# Patient Record
Sex: Male | Born: 1995 | Race: Black or African American | Hispanic: No | Marital: Single | Smoking: Never smoker
Health system: Southern US, Community
[De-identification: ages and names within clinical notes are randomized; demographics above are authoritative.]

## PROBLEM LIST (undated history)

## (undated) HISTORY — PX: HERNIA REPAIR: SHX51

---

## 2017-12-08 ENCOUNTER — Other Ambulatory Visit: Payer: Self-pay

## 2017-12-08 ENCOUNTER — Emergency Department
Admission: EM | Admit: 2017-12-08 | Discharge: 2017-12-08 | Disposition: A | Discharge status: Home / Self Care | Payer: Medicaid Other | Attending: Emergency Medicine | Admitting: Emergency Medicine

## 2017-12-08 DIAGNOSIS — S81811A Laceration without foreign body, right lower leg, initial encounter: Secondary | ICD-10-CM | POA: Insufficient documentation

## 2017-12-08 DIAGNOSIS — Y92310 Basketball court as the place of occurrence of the external cause: Secondary | ICD-10-CM | POA: Insufficient documentation

## 2017-12-08 DIAGNOSIS — W269XXA Contact with unspecified sharp object(s), initial encounter: Secondary | ICD-10-CM | POA: Diagnosis not present

## 2017-12-08 DIAGNOSIS — Y9367 Activity, basketball: Secondary | ICD-10-CM | POA: Diagnosis not present

## 2017-12-08 DIAGNOSIS — Y999 Unspecified external cause status: Secondary | ICD-10-CM | POA: Insufficient documentation

## 2017-12-08 MED ORDER — LIDOCAINE-EPINEPHRINE (PF) 1 %-1:200000 IJ SOLN
INTRAMUSCULAR | Status: AC
  Administered 2017-12-08: 30 mL
  Filled 2017-12-08: qty 30

## 2017-12-08 MED ORDER — TETANUS-DIPHTH-ACELL PERTUSSIS 5-2.5-18.5 LF-MCG/0.5 IM SUSP
INTRAMUSCULAR | Status: AC
  Administered 2017-12-08: 0.5 mL via INTRAMUSCULAR
  Filled 2017-12-08: qty 0.5

## 2017-12-08 MED ORDER — CEPHALEXIN 500 MG PO CAPS
500.0000 mg | ORAL_CAPSULE | Freq: Once | ORAL | Status: AC
  Administered 2017-12-08: 500 mg via ORAL

## 2017-12-08 MED ORDER — CEPHALEXIN 500 MG PO CAPS
ORAL_CAPSULE | ORAL | Status: AC
  Filled 2017-12-08: qty 1

## 2017-12-08 MED ORDER — CEPHALEXIN 500 MG PO CAPS: 500.0000 mg | ORAL_CAPSULE | Freq: Three times a day (TID) | ORAL | 0 refills | Status: AC

## 2017-12-08 NOTE — ED Provider Notes (Signed)
[facility] Emergency Department Provider Note  ____________________________________________   I have reviewed the triage vital signs and the nursing notes. Where available I have reviewed prior notes and, if possible and indicated, outside hospital notes.    HISTORY  Chief Complaint Laceration    HPI Adam Guerra is a 22 y.o. male basketball and cut his right lower leg on a bolt that was holding up the back stop.  Patient did not suffer any other injuries.  Bleeding controlled.  Anti-septic was applied by local observers immediately thereafter.  Tetanus not known to be up-to-date.    History reviewed. No pertinent past medical history.  There are no active problems to display for this patient.   Past Surgical History:  Procedure Laterality Date  . HERNIA REPAIR      Prior to Admission medications   Not on File    Allergies Patient has no known allergies.  No family history on file.  Social History Social History   Tobacco Use  . Smoking status: Never Smoker  . Smokeless tobacco: Never Used  Substance Use Topics  . Alcohol use: No    Frequency: Never  . Drug use: Yes    Types: Marijuana    Review of Systems    ____________________________________________   PHYSICAL EXAM:  VITAL SIGNS: ED Triage Vitals  Enc Vitals Group     BP 12/08/17 2134 137/87     Pulse Rate 12/08/17 2134 61     Resp 12/08/17 2134 16     Temp 12/08/17 2134 98.2 F (36.8 C)     Temp Source 12/08/17 2134 Oral     SpO2 12/08/17 2134 100 %     Weight 12/08/17 2131 135 lb (61.2 kg)     Height 12/08/17 2131 5\' 10"  (1.778 m)     Head Circumference --      Peak Flow --      Pain Score 12/08/17 2131 8     Pain Loc --      Pain Edu? --      Excl. in [place]? --     Constitutional: Alert and oriented. Well appearing and in no acute distress. Head: Atraumatic  Musculoskeletal: No lower extremity aside from the area of injury, no upper extremity  tenderness. No joint effusions, no DVT signs strong distal pulses no edema Neurologic:  Normal speech and language. No gross focal neurologic deficits are appreciated.  Skin:  Skin is warm, dry and intact.  There is an abrasion/laceration which is deep, towards the muscle 2.7 cm no foreign body noted. Psychiatric: Mood and affect are normal. Speech and behavior are normal.  ____________________________________________   LABS (all labs ordered are listed, but only abnormal results are displayed)  Labs Reviewed - No data to display  Pertinent labs  results that were available during my care of the patient were reviewed by me and considered in my medical decision making (see chart for details). ____________________________________________  EKG  I personally interpreted any EKGs ordered by me or triage  ____________________________________________  RADIOLOGY  Pertinent labs & imaging results that were available during my care of the patient were reviewed by me and considered in my medical decision making (see chart for details). If possible, patient and/or family made aware of any abnormal findings.  No results found. ____________________________________________    PROCEDURES  Procedure(s) performed: LACERATION REPAIR Performed by: Jeanmarie Plant Authorized by: Jeanmarie Plant Consent: Verbal consent obtained. Risks and benefits: risks, benefits and alternatives  were discussed Consent given by: patient Patient identity confirmed: provided demographic data Prepped and Draped in normal sterile fashion Wound explored  Laceration Location: Right pretibial region  Laceration Length: 2.7 cm cm  No Foreign Bodies seen or palpated extensive exploration showed no foreign body  Anesthesia: local infiltration  Local anesthetic: lidocaine 2% % with epinephrine  Anesthetic total: 5 ml  Irrigation method: syringe Amount of cleaning: standard  Skin closure: 3-0 nylon  Number  of sutures: 5  Technique: Interrupted  Patient tolerance: Patient tolerated the procedure well with no immediate complications.   Procedures  Critical Care performed: None  ____________________________________________   INITIAL IMPRESSION / ASSESSMENT AND PLAN / ED COURSE  Pertinent labs & imaging results that were available during my care of the patient were reviewed by me and considered in my medical decision making (see chart for details).  with a wound to his right lower extremity, it was incurred immediately prior to arrival.  Copious irrigation under pressure, chlorhexidine, tetanus shot, irrigation with normal saline after chlorhexidine, Keflex, sterile dressing, return precautions and follow-up.  No other injury noted, compartments soft.    ____________________________________________   FINAL CLINICAL IMPRESSION(S) / ED DIAGNOSES  Final diagnoses:  None      This chart was dictated using voice recognition software.  Despite best efforts to proofread,  errors can occur which can change meaning.      Jeanmarie PlantMcShane, Dresean Beckel A, MD 12/08/17 [id number]

## 2017-12-08 NOTE — Discharge Instructions (Signed)
Keep the area clean and dry, use a barrier petroleum-based product such as Vaseline or bacitracin, avoid strenuous exercise until the wound heals, have the suture removed in 7-10 days, return for any signs of infection including fever, redness, redness around the area, streaks from the area increased pain or pus.

## 2017-12-08 NOTE — ED Triage Notes (Signed)
Pt states he was playing basketball and lacerated his right lower leg on a rusty bolt

## 2019-09-24 ENCOUNTER — Encounter: Payer: Self-pay | Admitting: Family Medicine

## 2019-09-24 ENCOUNTER — Other Ambulatory Visit: Payer: Self-pay

## 2019-09-24 ENCOUNTER — Ambulatory Visit: Payer: Self-pay | Admitting: Family Medicine

## 2019-09-24 DIAGNOSIS — Z113 Encounter for screening for infections with a predominantly sexual mode of transmission: Secondary | ICD-10-CM

## 2019-09-24 DIAGNOSIS — Z202 Contact with and (suspected) exposure to infections with a predominantly sexual mode of transmission: Secondary | ICD-10-CM

## 2019-09-24 MED ORDER — AZITHROMYCIN 500 MG PO TABS
1000.0000 mg | ORAL_TABLET | Freq: Once | ORAL | Status: AC
  Administered 2019-09-24: 11:00:00 1000 mg via ORAL

## 2019-09-24 NOTE — Progress Notes (Signed)
  STI clinic/screening visit  Subjective:  Adam Guerra is a 23 y.o. male being seen today for an STI screening visit. The patient reports they do not have symptoms.    Patient has the following medical conditions:  There are no active problems to display for this patient.    Chief Complaint  Patient presents with  . SEXUALLY TRANSMITTED DISEASE    HPI  Patient reports girlfriend was seen here and treated for chlamydia, he requests treatment today as well. Denies symptoms. See flowsheet for further details and programmatic requirements.    The following portions of the patient's history were reviewed and updated as appropriate: allergies, current medications, past medical history, past social history, past surgical history and problem list.  Objective:  There were no vitals filed for this visit.  Physical Exam Constitutional:      Appearance: Normal appearance.  HENT:     Head: Normocephalic and atraumatic.     Comments: No nits or hair loss    Mouth/Throat:     Mouth: Mucous membranes are moist.     Pharynx: Oropharynx is clear. No oropharyngeal exudate or posterior oropharyngeal erythema.  Pulmonary:     Effort: Pulmonary effort is normal.  Abdominal:     General: Abdomen is flat.     Palpations: Abdomen is soft. There is no hepatomegaly or mass.     Tenderness: There is no abdominal tenderness.  Genitourinary:    Pubic Area: No rash or pubic lice.      Penis: Normal and circumcised.      Scrotum/Testes: Normal.     Epididymis:     Right: Normal.     Left: Normal.     Rectum: Normal.  Lymphadenopathy:     Head:     Right side of head: No preauricular or posterior auricular adenopathy.     Left side of head: No preauricular or posterior auricular adenopathy.     Cervical: No cervical adenopathy.     Upper Body:     Right upper body: No supraclavicular or axillary adenopathy.     Left upper body: No supraclavicular or axillary adenopathy.     Lower Body:  No right inguinal adenopathy. No left inguinal adenopathy.  Skin:    General: Skin is warm and dry.     Findings: No rash.  Neurological:     Mental Status: He is alert and oriented to person, place, and time.       Assessment and Plan:  Adam Guerra is a 23 y.o. male presenting to the [facility] Department for STI screening   1. Screening examination for venereal disease -Screenings today as below.  -Patient does meet criteria for HepB HepC Screening. Declines these screenings. Also declines HIV, syphilis screenings. -Discussed that GC/CT NAAT may take >1 week to result. Counseled on warning s/sx and when to seek care. Recommended condom use with all sex. - Gonococcus culture - urethra - Gonococcus culture - throat  2. Exposure to chlamydia Treatment today as below.  No sex for 7 days after both pt and partner completes treatment. RTC if vomits < 2 hr after taking medicine. Advised condoms with all sex. - azithromycin (ZITHROMAX) tablet 1,000 mg  Return for screening as needed.  No future appointments.  Kandee Keen, PA-C

## 2019-09-24 NOTE — Progress Notes (Signed)
Pt treated as a contact to Chlamydia per standing order and per Charlotte Sanes, PA-C order. Provider orders completed.Ronny Bacon, RN

## 2019-09-29 LAB — GONOCOCCUS CULTURE

## 2019-11-15 ENCOUNTER — Ambulatory Visit: Payer: Self-pay | Admitting: Physician Assistant

## 2019-11-15 ENCOUNTER — Other Ambulatory Visit: Payer: Self-pay

## 2019-11-15 DIAGNOSIS — Z113 Encounter for screening for infections with a predominantly sexual mode of transmission: Secondary | ICD-10-CM

## 2019-11-15 DIAGNOSIS — Z202 Contact with and (suspected) exposure to infections with a predominantly sexual mode of transmission: Secondary | ICD-10-CM

## 2019-11-15 MED ORDER — METRONIDAZOLE 500 MG PO TABS: 2000.0000 mg | ORAL_TABLET | Freq: Once | ORAL | 0 refills | Status: AC

## 2019-11-17 ENCOUNTER — Encounter: Payer: Self-pay | Admitting: Physician Assistant

## 2019-11-17 NOTE — Progress Notes (Signed)
   [facility] Department STI clinic/screening visit  Subjective:  Adam Guerra is a 24 y.o. male being seen today for an STI screening visit. The patient reports they do not have symptoms.    Patient has the following medical conditions:  There are no problems to display for this patient.    Chief Complaint  Patient presents with  . SEXUALLY TRANSMITTED DISEASE    HPI  Patient reports that he is a contact to [place].  Denies symptoms and declines screeing exam and blood work today.   See flowsheet for further details and programmatic requirements.    The following portions of the patient's history were reviewed and updated as appropriate: allergies, current medications, past medical history, past social history, past surgical history and problem list.  Objective:  There were no vitals filed for this visit.  Physical Exam Constitutional:      General: He is not in acute distress.    Appearance: Normal appearance. He is normal weight.  HENT:     Head: Normocephalic and atraumatic.  Pulmonary:     Effort: Pulmonary effort is normal.  Neurological:     Mental Status: He is alert and oriented to person, place, and time.  Psychiatric:        Mood and Affect: Mood normal.        Behavior: Behavior normal.        Thought Content: Thought content normal.        Judgment: Judgment normal.       Assessment and Plan:  Adam Guerra is a 24 y.o. male presenting to the [facility] Department for STI screening  1. Screening for STD (sexually transmitted disease) Patient into clinic without symptoms. Declines exam and blood work and requests treatment only today. Rec condoms with all sex.  2. Venereal disease contact Will treat as a contact to [place] with Metronidazole 2 g po at one time with food, no EtOH for 24 hr before and until 72 hr after completing medicine. No sex for 7 days and until after partner completes treatment. RTC for  re-treatment if vomits < 2 hr after taking medicine. - metroNIDAZOLE (FLAGYL) 500 MG tablet; Take 4 tablets (2,000 mg total) by mouth once for 1 dose.  Dispense: 4 tablet; Refill: 0     No follow-ups on file.  No future appointments.  Matt Holmes, PA

## 2019-12-04 ENCOUNTER — Other Ambulatory Visit: Payer: Self-pay

## 2019-12-04 ENCOUNTER — Encounter: Payer: Self-pay | Admitting: Intensive Care

## 2019-12-04 ENCOUNTER — Emergency Department
Admission: EM | Admit: 2019-12-04 | Discharge: 2019-12-04 | Disposition: A | Discharge status: Home / Self Care | Payer: HRSA Program | Attending: Emergency Medicine | Admitting: Emergency Medicine

## 2019-12-04 DIAGNOSIS — R197 Diarrhea, unspecified: Secondary | ICD-10-CM | POA: Insufficient documentation

## 2019-12-04 DIAGNOSIS — R110 Nausea: Secondary | ICD-10-CM | POA: Diagnosis not present

## 2019-12-04 DIAGNOSIS — U071 COVID-19: Secondary | ICD-10-CM | POA: Diagnosis not present

## 2019-12-04 DIAGNOSIS — R1010 Upper abdominal pain, unspecified: Secondary | ICD-10-CM | POA: Diagnosis present

## 2019-12-04 LAB — POC SARS CORONAVIRUS 2 AG
SARS Coronavirus 2 Ag: POSITIVE — AB
SARS Coronavirus 2 Ag: POSITIVE — AB

## 2019-12-04 LAB — COMPREHENSIVE METABOLIC PANEL
ALT: 23 U/L (ref 0–44)
AST: 31 U/L (ref 15–41)
Albumin: 4.3 g/dL (ref 3.5–5.0)
Alkaline Phosphatase: 80 U/L (ref 38–126)
Anion gap: 11 (ref 5–15)
BUN: 16 mg/dL (ref 6–20)
CO2: 26 mmol/L (ref 22–32)
Calcium: 9.3 mg/dL (ref 8.9–10.3)
Chloride: 100 mmol/L (ref 98–111)
Creatinine, Ser: 1.17 mg/dL (ref 0.61–1.24)
GFR calc Af Amer: >60 mL/min (ref >60)
GFR calc non Af Amer: >60 mL/min (ref >60)
Glucose, Bld: 103 mg/dL — ABNORMAL HIGH (ref 70–99)
Potassium: 3.9 mmol/L (ref 3.5–5.1)
Sodium: 137 mmol/L (ref 135–145)
Total Bilirubin: 0.8 mg/dL (ref 0.3–1.2)
Total Protein: 8.2 g/dL — ABNORMAL HIGH (ref 6.5–8.1)

## 2019-12-04 LAB — CBC
HCT: 48.3 % (ref 39.0–52.0)
Hemoglobin: 16 g/dL (ref 13.0–17.0)
MCH: 28.3 pg (ref 26.0–34.0)
MCHC: 33.1 g/dL (ref 30.0–36.0)
MCV: 85.5 fL (ref 80.0–100.0)
Platelets: 176 10*3/uL (ref 150–400)
RBC: 5.65 MIL/uL (ref 4.22–5.81)
RDW: 13.2 % (ref 11.5–15.5)
WBC: 8.3 10*3/uL (ref 4.0–10.5)
nRBC: 0 % (ref 0.0–0.2)

## 2019-12-04 LAB — LIPASE, BLOOD: Lipase: 28 U/L (ref 11–51)

## 2019-12-04 MED ORDER — ONDANSETRON 4 MG PO TBDP: 4.0000 mg | ORAL_TABLET | Freq: Three times a day (TID) | ORAL | 0 refills | Status: DC | PRN

## 2019-12-04 NOTE — ED Triage Notes (Signed)
Pt c/o abd pain that started through the night with some diarrhea. Denies N/V

## 2019-12-04 NOTE — ED Notes (Signed)
ED Provider Paduchowski at bedside. 

## 2019-12-04 NOTE — ED Provider Notes (Addendum)
[facility] Emergency Department Provider Note  Time seen: 7:43 AM  I have reviewed the triage vital signs and the nursing notes.   HISTORY  Chief Complaint Abdominal Pain   HPI Adam Guerra is a 24 y.o. male with no past medical history presents to the emergency department for nausea and upper abdominal discomfort.  According to the patient approximately 3 to 4 hours ago he awoke with a sensation of discomfort in the upper abdomen with some mild nausea.  He states yesterday he had an episode of diarrhea.  Patient states the nausea and discomfort has since resolved.  Patient's girlfriend with whom he lives currently has coronavirus diagnosed 2 days ago.  Patient was concerned that he could be developing corona symptoms.  Denies any cough shortness of breath or fever.   History reviewed. No pertinent past medical history.  There are no problems to display for this patient.   Past Surgical History:  Procedure Laterality Date  . HERNIA REPAIR      Prior to Admission medications   Not on File    No Known Allergies  Family History  Problem Relation Age of Onset  . Hypertension Brother   . Diabetes Brother   . Diabetes Maternal Grandmother     Social History Social History   Tobacco Use  . Smoking status: Never Smoker  . Smokeless tobacco: Never Used  Substance Use Topics  . Alcohol use: Yes    Comment: occ  . Drug use: Yes    Frequency: 14.0 times per week    Types: Marijuana    Review of Systems Constitutional: Negative for fever. ENT: Negative for congestion. Cardiovascular: Negative for chest pain. Respiratory: Negative for shortness of breath.  Negative for cough. Gastrointestinal: Mild epigastric discomfort since resolved.  Mild nausea but no vomiting.  1 episode of diarrhea yesterday. Genitourinary: Negative for urinary compaints Musculoskeletal: Negative for musculoskeletal complaints Neurological: Negative for headache All  other ROS negative  ____________________________________________   PHYSICAL EXAM:  VITAL SIGNS: ED Triage Vitals  Enc Vitals Group     BP 12/04/19 0723 139/86     Pulse Rate 12/04/19 0723 72     Resp 12/04/19 0723 16     Temp 12/04/19 0723 98.4 F (36.9 C)     Temp Source 12/04/19 0723 Oral     SpO2 12/04/19 0723 100 %     Weight 12/04/19 0722 140 lb (63.5 kg)     Height 12/04/19 0722 5\' 10"  (1.778 m)     Head Circumference --      Peak Flow --      Pain Score 12/04/19 0722 8     Pain Loc --      Pain Edu? --      Excl. in [place]? --    Constitutional: Alert and oriented. Well appearing and in no distress. Eyes: Normal exam ENT      Head: Normocephalic and atraumatic.      Mouth/Throat: Mucous membranes are moist. Cardiovascular: Normal rate, regular rhythm.  Respiratory: Normal respiratory effort without tachypnea nor retractions. Breath sounds are clear Gastrointestinal: Soft and nontender. No distention.   Musculoskeletal: Nontender with normal range of motion in all extremities.  Neurologic:  Normal speech and language. No gross focal neurologic deficits  Skin:  Skin is warm, dry and intact.  Psychiatric: Mood and affect are normal.   ____________________________________________  INITIAL IMPRESSION / ASSESSMENT AND PLAN / ED COURSE  Pertinent labs & imaging results that were  available during my care of the patient were reviewed by me and considered in my medical decision making (see chart for details).   Patient presents emergency department for epigastric discomfort and nausea which has since resolved.  States one episode of diarrhea.  Patient's girlfriend with whom he lives currently has coronavirus diagnosed 2 days ago.  Patient was concerned he could be developing coronavirus as well.  We will check labs, swab for coronavirus and continue to close monitor.  Overall the patient appears well reassuring vitals and reassuring physical exam, benign abdominal  exam.  Patient's Covid test is positive.  We will discharge with Zofran discussed supportive care and follow-up precautions.  Patient agreeable to plan.  Blood work was largely nonrevealing.  DHILLON COMUNALE was evaluated in Emergency Department on 12/04/2019 for the symptoms described in the history of present illness. He was evaluated in the context of the global COVID-19 pandemic, which necessitated consideration that the patient might be at risk for infection with the SARS-CoV-2 virus that causes COVID-19. Institutional protocols and algorithms that pertain to the evaluation of patients at risk for COVID-19 are in a state of rapid change based on information released by regulatory bodies including the CDC and federal and state organizations. These policies and algorithms were followed during the patient's care in the ED.  ____________________________________________   FINAL CLINICAL IMPRESSION(S) / ED DIAGNOSES  Abdominal discomfort Nausea Diarrhea   Minna Antis, MD 12/04/19 [id number]    Minna Antis, MD 12/04/19 [phone]

## 2019-12-23 NOTE — Progress Notes (Signed)
Chart reviewed by Pharmacist  Suzanne Walker PharmD, Contract Pharmacist at [facility]  

## 2020-03-16 ENCOUNTER — Encounter: Payer: Self-pay | Admitting: Emergency Medicine

## 2020-03-16 ENCOUNTER — Emergency Department: Payer: Medicaid Other

## 2020-03-16 ENCOUNTER — Emergency Department
Admission: EM | Admit: 2020-03-16 | Discharge: 2020-03-16 | Disposition: A | Discharge status: Home / Self Care | Payer: Medicaid Other | Attending: Emergency Medicine | Admitting: Emergency Medicine

## 2020-03-16 ENCOUNTER — Other Ambulatory Visit: Payer: Self-pay

## 2020-03-16 DIAGNOSIS — Y92321 Football field as the place of occurrence of the external cause: Secondary | ICD-10-CM | POA: Insufficient documentation

## 2020-03-16 DIAGNOSIS — X509XXA Other and unspecified overexertion or strenuous movements or postures, initial encounter: Secondary | ICD-10-CM | POA: Insufficient documentation

## 2020-03-16 DIAGNOSIS — Y9361 Activity, american tackle football: Secondary | ICD-10-CM | POA: Insufficient documentation

## 2020-03-16 DIAGNOSIS — S93492A Sprain of other ligament of left ankle, initial encounter: Secondary | ICD-10-CM | POA: Insufficient documentation

## 2020-03-16 DIAGNOSIS — Y998 Other external cause status: Secondary | ICD-10-CM | POA: Insufficient documentation

## 2020-03-16 DIAGNOSIS — F1210 Cannabis abuse, uncomplicated: Secondary | ICD-10-CM | POA: Insufficient documentation

## 2020-03-16 IMAGING — CR DG ANKLE COMPLETE 3+V*L*
3 series · 3 of 3 positions shown · non-contrast
Comparison: None.

CLINICAL DATA: Pain.

EXAM:
LEFT ANKLE COMPLETE - 3+ VIEW

[ankle ap]
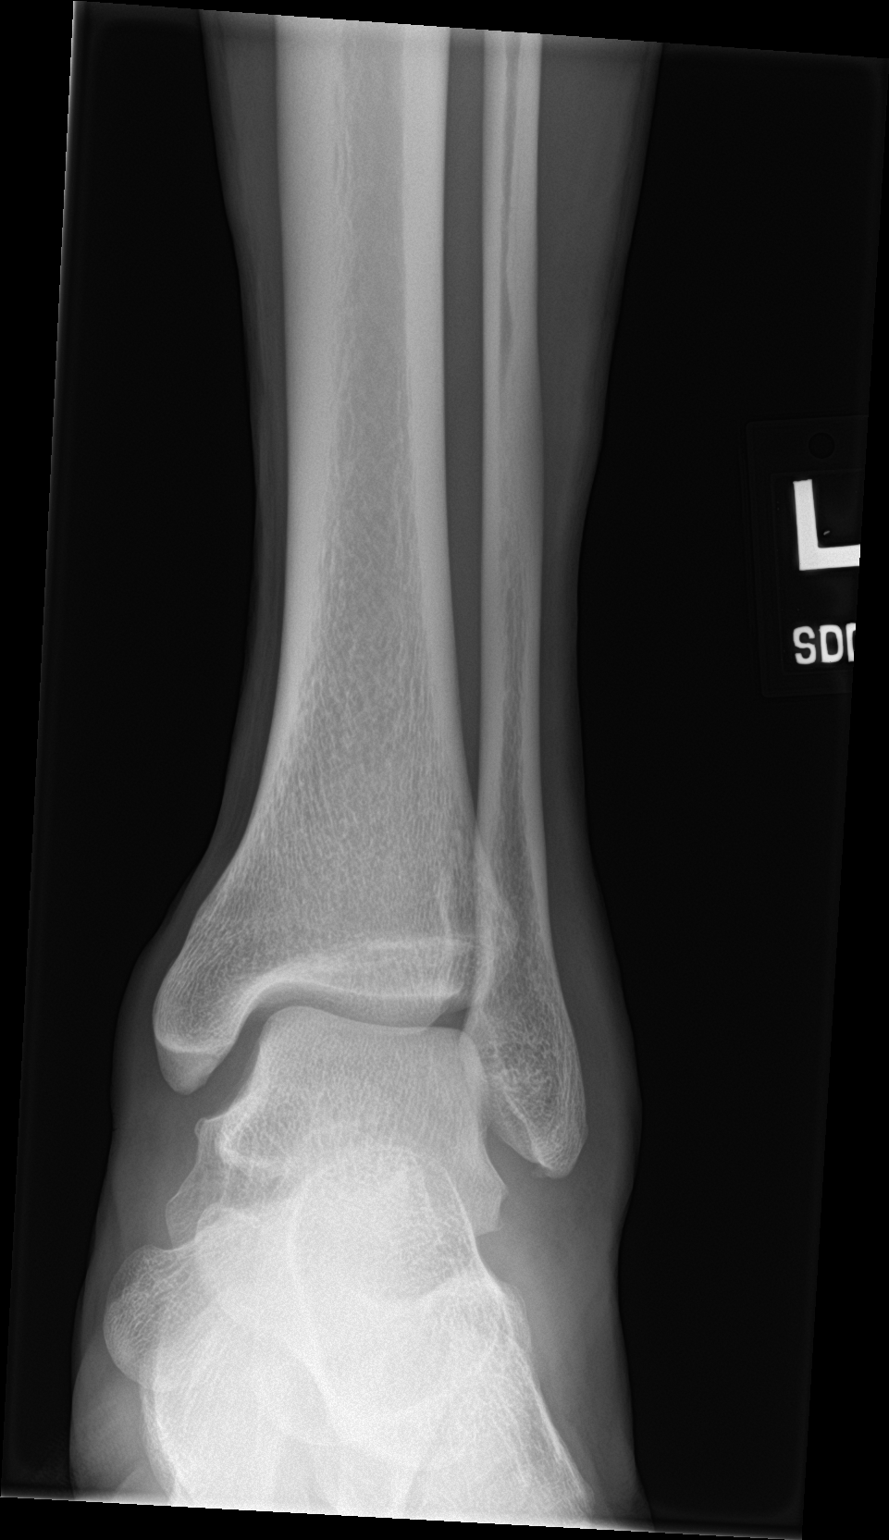

[ankle obl]
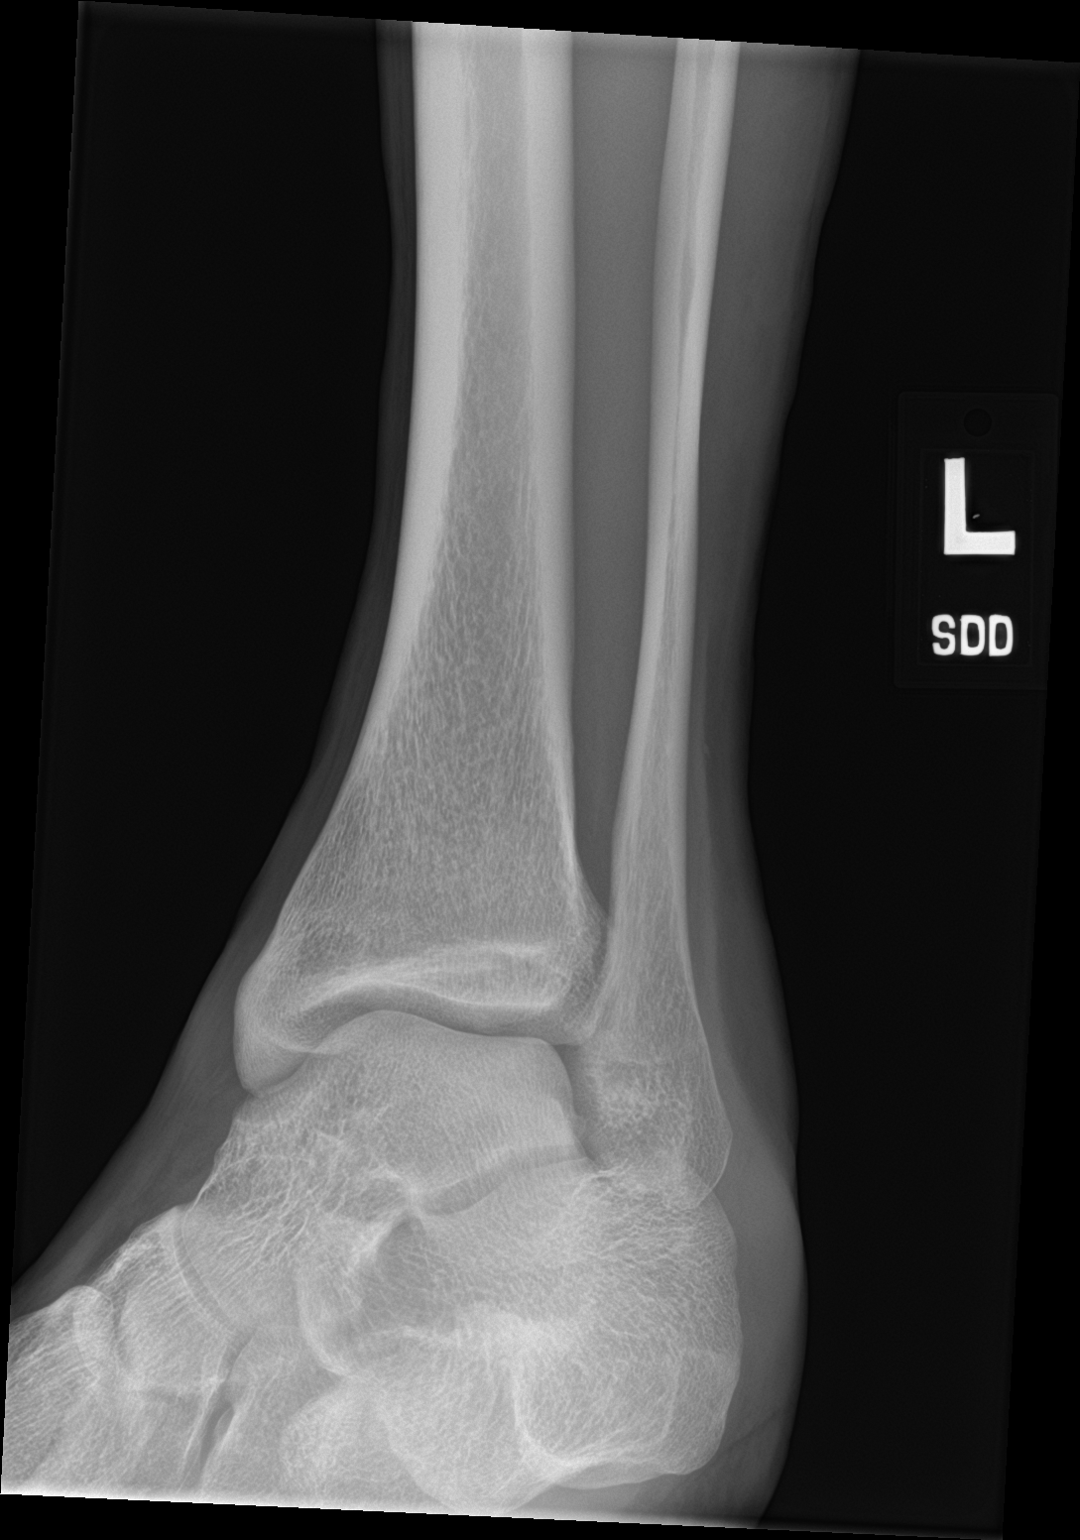

[ankle lat]
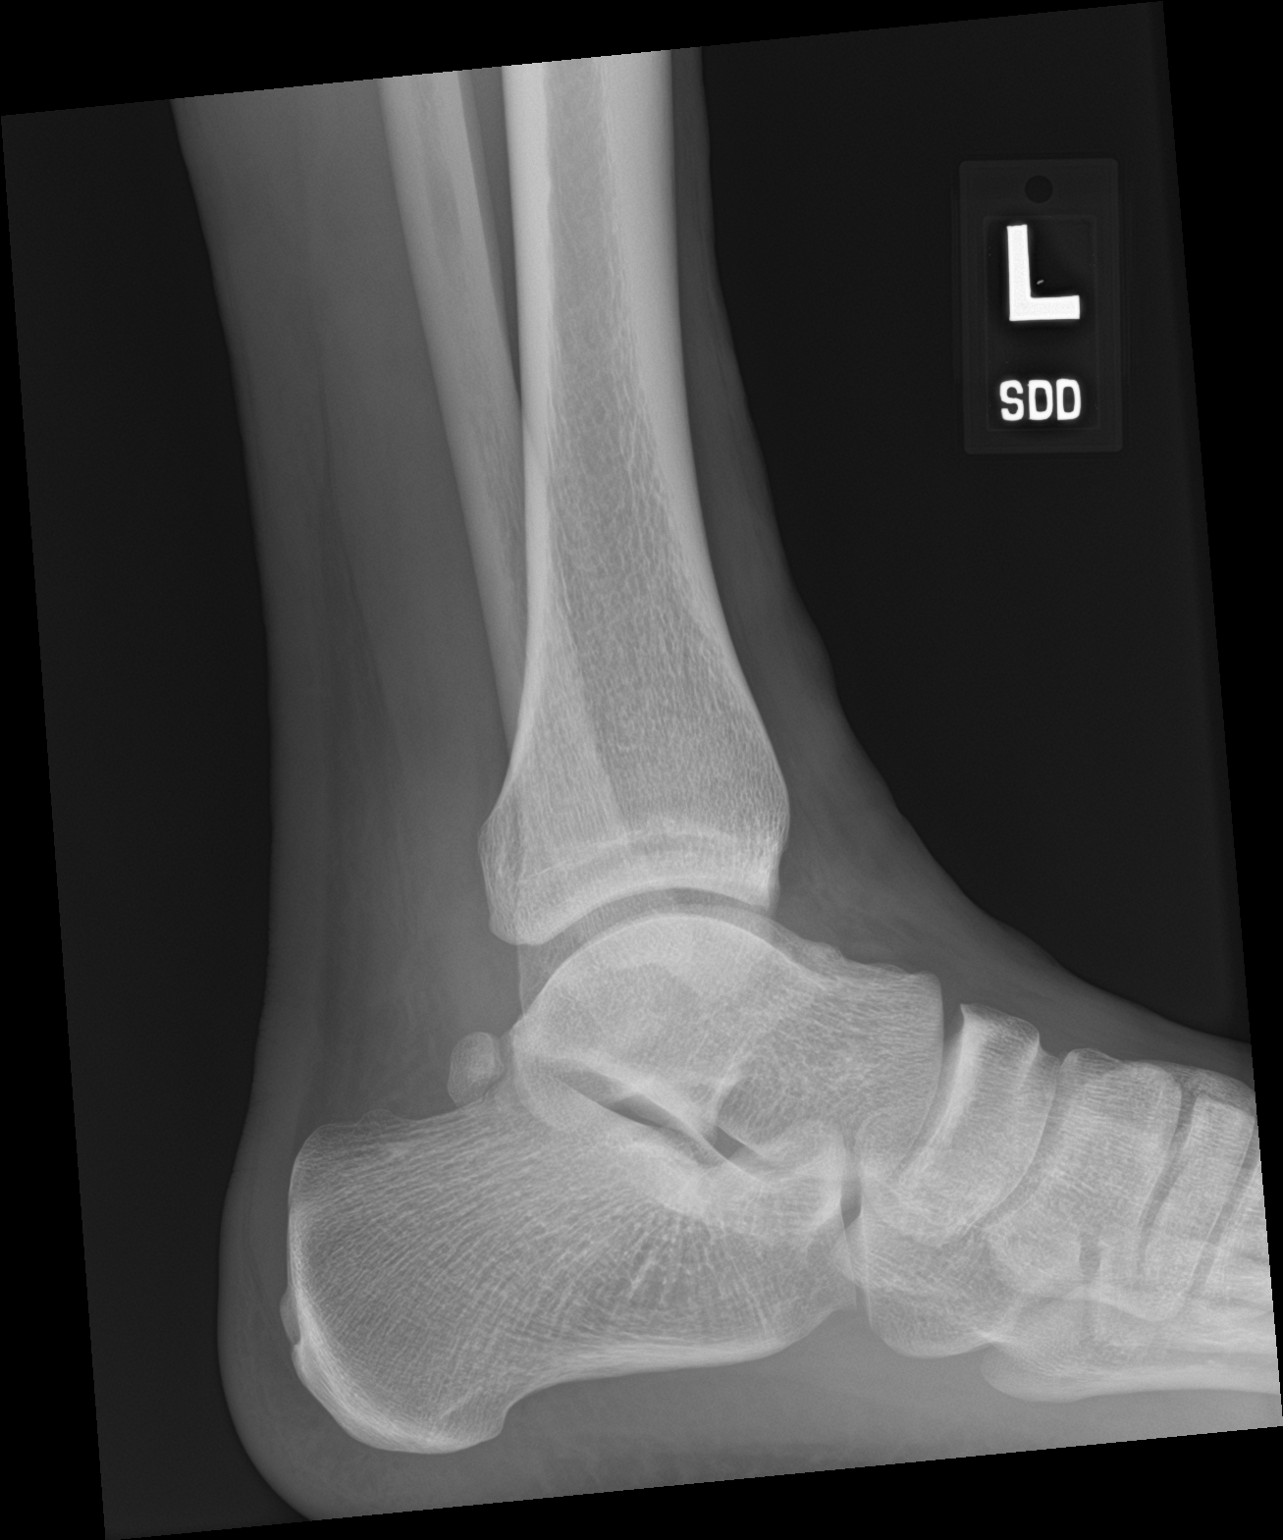

[3 of 3 positions shown; findings below may reference images not displayed]

FINDINGS: There is soft tissue swelling ankle. There is no acute displaced
fracture. There is no dislocation. There is a questionable tiny well
corticated osseous fragment adjacent to the tip of the lateral
malleolus. This may related to an old remote injury.
IMPRESSION: Soft tissue swelling without acute fracture or dislocation.

## 2020-03-16 MED ORDER — MELOXICAM 15 MG PO TABS: 15.0000 mg | ORAL_TABLET | Freq: Every day | ORAL | 0 refills | Status: DC

## 2020-03-16 NOTE — ED Provider Notes (Signed)
[facility] Emergency Department Provider Note  ____________________________________________  Time seen: Approximately 8:30 PM  I have reviewed the triage vital signs and the nursing notes.   HISTORY  Chief Complaint Ankle Pain    HPI Adam Guerra is a 24 y.o. male who presents the emergency department complaining of left ankle pain.  Patient states that he was throwing a football in the sidelines of a game, jumped for past, landed and landed awkwardly on the left foot.  Patient has been complaining of pain to the lateral aspect of the ankle.  No other injury or complaint.  Patient states that he has sprained the ankle multiple times and arrives with a brace on.         History reviewed. No pertinent past medical history.  There are no problems to display for this patient.   Past Surgical History:  Procedure Laterality Date  . HERNIA REPAIR      Prior to Admission medications   Medication Sig Start Date End Date Taking? Authorizing Provider  meloxicam (MOBIC) 15 MG tablet Take 1 tablet (15 mg total) by mouth daily. 03/16/20   Cuthriell, Charline Bills, PA-C  ondansetron (ZOFRAN ODT) 4 MG disintegrating tablet Take 1 tablet (4 mg total) by mouth every 8 (eight) hours as needed for nausea or vomiting. 12/04/19   Harvest Dark, MD    Allergies Patient has no known allergies.  Family History  Problem Relation Age of Onset  . Hypertension Brother   . Diabetes Brother   . Diabetes Maternal Grandmother     Social History Social History   Tobacco Use  . Smoking status: Never Smoker  . Smokeless tobacco: Never Used  Substance Use Topics  . Alcohol use: Yes    Comment: occ  . Drug use: Yes    Frequency: 14.0 times per week    Types: Marijuana     Review of Systems  Constitutional: No fever/chills Eyes: No visual changes. No discharge ENT: No upper respiratory complaints. Cardiovascular: no chest pain. Respiratory: no cough. No  SOB. Gastrointestinal: No abdominal pain.  No nausea, no vomiting.  No diarrhea.  No constipation. Musculoskeletal: Left ankle pain/injury Skin: Negative for rash, abrasions, lacerations, ecchymosis. Neurological: Negative for headaches, focal weakness or numbness. 10-point ROS otherwise negative.  ____________________________________________   PHYSICAL EXAM:  VITAL SIGNS: ED Triage Vitals  Enc Vitals Group     BP 03/16/20 1905 128/67     Pulse Rate 03/16/20 1905 96     Resp 03/16/20 1905 18     Temp 03/16/20 1905 98.6 F (37 C)     Temp Source 03/16/20 1905 Oral     SpO2 03/16/20 1905 99 %     Weight 03/16/20 1903 140 lb (63.5 kg)     Height 03/16/20 1903 5\' 10"  (1.778 m)     Head Circumference --      Peak Flow --      Pain Score 03/16/20 1903 9     Pain Loc --      Pain Edu? --      Excl. in [place]? --      Constitutional: Alert and oriented. Well appearing and in no acute distress. Eyes: Conjunctivae are normal. PERRL. EOMI. Head: Atraumatic. ENT:      Ears:       Nose: No congestion/rhinnorhea.      Mouth/Throat: Mucous membranes are moist.  Neck: No stridor.    Cardiovascular: Normal rate, regular rhythm. Normal S1 and S2.  Good peripheral circulation. Respiratory: Normal respiratory effort without tachypnea or retractions. Lungs CTAB. Good air entry to the bases with no decreased or absent breath sounds. Musculoskeletal: Full range of motion to all extremities. No gross deformities appreciated.  No gross edema, deformity noted.  Patient is tender to palpation along the lateral aspect of the ankle.  No palpable abnormality.  [place] Neurologic:  Normal speech and language. No gross focal neurologic deficits are appreciated.  Skin:  Skin is warm, dry and intact. No rash noted. Psychiatric: Mood and affect are normal. Speech and behavior are normal. Patient exhibits appropriate insight and judgement.   ____________________________________________   LABS (all labs  ordered are listed, but only abnormal results are displayed)  Labs Reviewed - No data to display ____________________________________________  EKG   ____________________________________________  RADIOLOGY I personally viewed and evaluated these images as part of my medical decision making, as well as reviewing the written report by the radiologist.  DG Ankle Complete Left  Result Date: 03/16/2020 CLINICAL DATA:  Pain. EXAM: LEFT ANKLE COMPLETE - 3+ VIEW COMPARISON:  None. FINDINGS: There is soft tissue swelling ankle. There is no acute displaced fracture. There is no dislocation. There is a questionable tiny well corticated osseous fragment adjacent to the tip of the lateral malleolus. This may related to an old remote injury. IMPRESSION: Soft tissue swelling without acute fracture or dislocation. Electronically Signed   By: Katherine Mantle M.D.   On: 03/16/2020 19:34    ____________________________________________    PROCEDURES  Procedure(s) performed:    Procedures    Medications - No data to display   ____________________________________________   INITIAL IMPRESSION / ASSESSMENT AND PLAN / ED COURSE  Pertinent labs & imaging results that were available during my care of the patient were reviewed by me and considered in my medical decision making (see chart for details).  Review of the [place] CSRS was performed in accordance of the [facility] prior to dispensing any controlled drugs.           Patient's diagnosis is consistent with sprain.  Patient presented to emergency department after sustaining an injury while playing football yesterday.  Exam was reassuring.  Imaging reveals no acute osseous abnormality.  Patient is to continue use of ankle brace at home.  Anti-inflammatories for symptom relief.  Follow-up primary care as needed. Patient is given ED precautions to return to the ED for any worsening or new  symptoms.     ____________________________________________  FINAL CLINICAL IMPRESSION(S) / ED DIAGNOSES  Final diagnoses:  Sprain of anterior talofibular ligament of left ankle, initial encounter      NEW MEDICATIONS STARTED DURING THIS VISIT:  ED Discharge Orders         Ordered    meloxicam (MOBIC) 15 MG tablet  Daily     03/16/20 2035              This chart was dictated using voice recognition software/Dragon. Despite best efforts to proofread, errors can occur which can change the meaning. Any change was purely unintentional.    Lanette Hampshire 03/16/20 2036    Sharman Cheek, MD 03/16/20 2213

## 2020-03-16 NOTE — ED Triage Notes (Signed)
Pt arrives POV to triage with c/o rolling his ankle at a football game yesterday. Pt denies any head injury or LOC and is in NAD.

## 2020-03-31 ENCOUNTER — Encounter: Payer: Self-pay | Admitting: Emergency Medicine

## 2020-03-31 ENCOUNTER — Emergency Department
Admission: EM | Admit: 2020-03-31 | Discharge: 2020-03-31 | Disposition: A | Discharge status: Home / Self Care | Payer: Medicaid Other | Attending: Emergency Medicine | Admitting: Emergency Medicine

## 2020-03-31 ENCOUNTER — Other Ambulatory Visit: Payer: Self-pay

## 2020-03-31 DIAGNOSIS — R21 Rash and other nonspecific skin eruption: Secondary | ICD-10-CM

## 2020-03-31 MED ORDER — HYDROXYZINE HCL 25 MG PO TABS: 25.0000 mg | ORAL_TABLET | Freq: Three times a day (TID) | ORAL | 0 refills | Status: DC | PRN

## 2020-03-31 MED ORDER — KETOCONAZOLE 2 % EX CREA: 1.0000 "application " | TOPICAL_CREAM | Freq: Two times a day (BID) | CUTANEOUS | 0 refills | Status: DC

## 2020-03-31 NOTE — ED Triage Notes (Signed)
Rash to right ear, neck and arm.  AAOx3.  Skin warm and dry. NAD

## 2020-03-31 NOTE — ED Provider Notes (Signed)
[facility] Emergency Department Provider Note  ____________________________________________  Time seen: Approximately 6:17 PM  I have reviewed the triage vital signs and the nursing notes.   HISTORY  Chief Complaint Rash   HPI Adam Guerra is a 24 y.o. male who presents to the emergency department for treatment and evaluation of facial rash.  He notes that this morning.  He states he has had some cold symptoms but otherwise has been healthy.  No previous symptoms of the same in the past.  He denies sore throat.  Area itches.  He has attempted using some over-the-counter cream and that did provide some temporary relief.  He noticed that the rash was spreading and decided to come to the emergency department.   History reviewed. No pertinent past medical history.  There are no problems to display for this patient.   Past Surgical History:  Procedure Laterality Date  . HERNIA REPAIR      Prior to Admission medications   Medication Sig Start Date End Date Taking? Authorizing Provider  hydrOXYzine (ATARAX/VISTARIL) 25 MG tablet Take 1 tablet (25 mg total) by mouth 3 (three) times daily as needed. 03/31/20   Janos Shampine, Johnette Abraham B, FNP  ketoconazole (NIZORAL) 2 % cream Apply 1 application topically 2 (two) times daily. 03/31/20   Victorino Dike, FNP    Allergies Patient has no known allergies.  Family History  Problem Relation Age of Onset  . Hypertension Brother   . Diabetes Brother   . Diabetes Maternal Grandmother     Social History Social History   Tobacco Use  . Smoking status: Never Smoker  . Smokeless tobacco: Never Used  Substance Use Topics  . Alcohol use: Yes    Comment: occ  . Drug use: Yes    Frequency: 14.0 times per week    Types: Marijuana    Review of Systems  Constitutional: Negative for fever. Respiratory: Negative for cough or shortness of breath.  Musculoskeletal: Negative for myalgias Skin: Positive for  rash Neurological: Negative for numbness or paresthesias. ____________________________________________   PHYSICAL EXAM:  VITAL SIGNS: ED Triage Vitals  Enc Vitals Group     BP 03/31/20 1417 123/77     Pulse Rate 03/31/20 1417 78     Resp 03/31/20 1417 16     Temp 03/31/20 1417 98 F (36.7 C)     Temp Source 03/31/20 1417 Oral     SpO2 03/31/20 1417 99 %     Weight 03/31/20 1412 139 lb 15.9 oz (63.5 kg)     Height 03/31/20 1412 5\' 10"  (1.778 m)     Head Circumference --      Peak Flow --      Pain Score 03/31/20 1412 0     Pain Loc --      Pain Edu? --      Excl. in [place]? --      Constitutional: Well appearing. Eyes: Conjunctivae are clear without discharge or drainage. Nose: No rhinorrhea noted. Mouth/Throat: Airway is patent.  Neck: No stridor. Unrestricted range of motion observed. Cardiovascular: Capillary refill is <3 seconds.  Respiratory: Respirations are even and unlabored.. Musculoskeletal: Unrestricted range of motion observed. Neurologic: Awake, alert, and oriented x 4.  Skin: Fine, skin colored, maculopapular rash overlying the forehead, and left side of his face as well as left posterior neck.  Rash is also on the right side of the neck and right ear.  ____________________________________________   LABS (all labs ordered are listed, but  only abnormal results are displayed)  Labs Reviewed - No data to display ____________________________________________  EKG  Not indicated. ____________________________________________  RADIOLOGY  Not indicated ____________________________________________   PROCEDURES  Procedures ____________________________________________   INITIAL IMPRESSION / ASSESSMENT AND PLAN / ED COURSE  Adam Guerra is a 23 y.o. male presents to the emergency department for treatment and evaluation of rash.  See HPI for further details.  Plan will be to treat him with Atarax and ketoconazole.  He was encouraged to follow-up  with primary care or return to the emergency department for symptoms of change or worsen or for new concerns.   Medications - No data to display   Pertinent labs & imaging results that were available during my care of the patient were reviewed by me and considered in my medical decision making (see chart for details).  ____________________________________________   FINAL CLINICAL IMPRESSION(S) / ED DIAGNOSES  Final diagnoses:  Rash and nonspecific skin eruption    ED Discharge Orders         Ordered    ketoconazole (NIZORAL) 2 % cream  2 times daily     03/31/20 1714    hydrOXYzine (ATARAX/VISTARIL) 25 MG tablet  3 times daily PRN     03/31/20 1715           Note:  This document was prepared using Dragon voice recognition software and may include unintentional dictation errors.   Chinita Pester, FNP 03/31/20 1820    Emily Filbert, MD 04/01/20 [phone]

## 2020-03-31 NOTE — ED Notes (Signed)
See triage note  Presents with rash behind ears  And on to neck  Noticed slight rash to hand this am

## 2021-01-06 ENCOUNTER — Other Ambulatory Visit: Payer: Self-pay

## 2021-01-06 DIAGNOSIS — Z5321 Procedure and treatment not carried out due to patient leaving prior to being seen by health care provider: Secondary | ICD-10-CM | POA: Insufficient documentation

## 2021-01-06 DIAGNOSIS — R519 Headache, unspecified: Secondary | ICD-10-CM | POA: Insufficient documentation

## 2021-01-06 MED ORDER — IBUPROFEN 800 MG PO TABS
800.0000 mg | ORAL_TABLET | Freq: Once | ORAL | Status: AC
  Administered 2021-01-06: 800 mg via ORAL

## 2021-01-06 NOTE — ED Triage Notes (Signed)
Pt states for the past 2-3 days he has had a constant headache. Pt denies any light sensitivity, numbness or nausea associated with headache. Pt states he has take 1g tylenol at home for pain and it has helped some.

## 2021-01-07 ENCOUNTER — Emergency Department
Admission: EM | Admit: 2021-01-07 | Discharge: 2021-01-07 | Disposition: A | Discharge status: Home / Self Care | Payer: Medicaid Other | Attending: Emergency Medicine | Admitting: Emergency Medicine

## 2021-01-08 ENCOUNTER — Ambulatory Visit
Admission: EM | Admit: 2021-01-08 | Discharge: 2021-01-08 | Disposition: A | Discharge status: Home / Self Care | Payer: Medicaid Other | Attending: Family Medicine | Admitting: Family Medicine

## 2021-01-08 ENCOUNTER — Encounter: Payer: Self-pay | Admitting: Emergency Medicine

## 2021-01-08 ENCOUNTER — Other Ambulatory Visit: Payer: Self-pay

## 2021-01-08 DIAGNOSIS — J0110 Acute frontal sinusitis, unspecified: Secondary | ICD-10-CM

## 2021-01-08 MED ORDER — KETOROLAC TROMETHAMINE 10 MG PO TABS: 10.0000 mg | ORAL_TABLET | Freq: Four times a day (QID) | ORAL | 0 refills | Status: DC | PRN

## 2021-01-08 MED ORDER — AMOXICILLIN-POT CLAVULANATE 875-125 MG PO TABS: 1.0000 | ORAL_TABLET | Freq: Two times a day (BID) | ORAL | 0 refills | Status: DC

## 2021-01-08 NOTE — ED Triage Notes (Signed)
Patient c/o headache x 5 days. He reports he went to the ER but the wait was too long. He states they gave him 800mg  Ibuprofen and this helped. He does report nasal congestion but has had this for several months.

## 2021-01-08 NOTE — ED Provider Notes (Signed)
[facility]    CSN: [id number] Arrival date & time: 01/08/21  1153      History   Chief Complaint Chief Complaint  Patient presents with  . Headache  . Facial Pain  . Nasal Congestion   HPI  25 year old male presents with the above complaints.  Patient reports ongoing rhinorrhea and congestion.  He states that this is chronic but has been worse as of late.  He states that he believes he suffers from allergies.  He reports left-sided frontal headache.  He has taken ibuprofen with relief but without resolution.  His significant other has recently been sick with similar symptoms.  No documented fever.  No known exacerbating factors.  Past Surgical History:  Procedure Laterality Date  . HERNIA REPAIR      Home Medications    Prior to Admission medications   Medication Sig Start Date End Date Taking? Authorizing Provider  amoxicillin-clavulanate (AUGMENTIN) 875-125 MG tablet Take 1 tablet by mouth 2 (two) times daily. 01/08/21  Yes Seline Enzor G, DO  ketorolac (TORADOL) 10 MG tablet Take 1 tablet (10 mg total) by mouth every 6 (six) hours as needed for moderate pain or severe pain. 01/08/21  Yes Tommie Sams, DO    Family History Family History  Problem Relation Age of Onset  . Hypertension Brother   . Diabetes Brother   . Diabetes Maternal Grandmother   . Healthy Mother   . Healthy Father     Social History Social History   Tobacco Use  . Smoking status: Never Smoker  . Smokeless tobacco: Never Used  Vaping Use  . Vaping Use: Never used  Substance Use Topics  . Alcohol use: Yes    Comment: occ  . Drug use: Yes    Frequency: 14.0 times per week    Types: Marijuana     Allergies   Patient has no known allergies.   Review of Systems Review of Systems  Constitutional: Negative for fever.  HENT: Positive for congestion, sinus pressure and sinus pain.   Neurological: Positive for headaches.   Physical Exam Triage Vital Signs ED Triage Vitals   Enc Vitals Group     BP 01/08/21 1215 110/80     Pulse Rate 01/08/21 1215 78     Resp 01/08/21 1215 18     Temp 01/08/21 1215 98.2 F (36.8 C)     Temp Source 01/08/21 1215 Oral     SpO2 01/08/21 1215 100 %     Weight 01/08/21 1212 135 lb (61.2 kg)     Height 01/08/21 1212 5\' 10"  (1.778 m)     Head Circumference --      Peak Flow --      Pain Score 01/08/21 1212 10     Pain Loc --      Pain Edu? --      Excl. in [place]? --    Updated Vital Signs BP 110/80 (BP Location: Left Arm)   Pulse 78   Temp 98.2 F (36.8 C) (Oral)   Resp 18   Ht 5\' 10"  (1.778 m)   Wt 61.2 kg   SpO2 100%   BMI 19.37 kg/m   Visual Acuity Right Eye Distance:   Left Eye Distance:   Bilateral Distance:    Right Eye Near:   Left Eye Near:    Bilateral Near:     Physical Exam Vitals and nursing note reviewed.  Constitutional:      General: He is not in  acute distress.    Appearance: Normal appearance. He is not ill-appearing.  HENT:     Head: Normocephalic and atraumatic.     Comments: Tenderness over the left frontal sinus.    Nose: Congestion present.     Comments: Nasal discharge noted. Eyes:     General:        Right eye: No discharge.        Left eye: No discharge.     Conjunctiva/sclera: Conjunctivae normal.  Cardiovascular:     Rate and Rhythm: Normal rate and regular rhythm.     Heart sounds: No murmur heard.   Pulmonary:     Effort: Pulmonary effort is normal.     Breath sounds: Normal breath sounds. No wheezing, rhonchi or rales.  Neurological:     Mental Status: He is alert.  Psychiatric:        Mood and Affect: Mood normal.        Behavior: Behavior normal.    UC Treatments / Results  Labs (all labs ordered are listed, but only abnormal results are displayed) Labs Reviewed - No data to display  EKG   Radiology No results found.  Procedures Procedures (including critical care time)  Medications Ordered in UC Medications - No data to display  Initial  Impression / Assessment and Plan / UC Course  I have reviewed the triage vital signs and the nursing notes.  Pertinent labs & imaging results that were available during my care of the patient were reviewed by me and considered in my medical decision making (see chart for details).    25 year old male presents with frontal sinusitis.  Treating with Augmentin and Toradol.  Final Clinical Impressions(s) / UC Diagnoses   Final diagnoses:  Acute frontal sinusitis, recurrence not specified   Discharge Instructions   None    ED Prescriptions    Medication Sig Dispense Auth. Provider   amoxicillin-clavulanate (AUGMENTIN) 875-125 MG tablet Take 1 tablet by mouth 2 (two) times daily. 20 tablet Tarren Velardi G, DO   ketorolac (TORADOL) 10 MG tablet Take 1 tablet (10 mg total) by mouth every 6 (six) hours as needed for moderate pain or severe pain. 20 tablet Tommie Sams, DO     PDMP not reviewed this encounter.   Tommie Sams, [place] 01/08/21 1253

## 2021-12-18 ENCOUNTER — Emergency Department
Admission: EM | Admit: 2021-12-18 | Discharge: 2021-12-18 | Disposition: A | Discharge status: Home / Self Care | Payer: Medicaid Other | Attending: Emergency Medicine | Admitting: Emergency Medicine

## 2021-12-18 ENCOUNTER — Emergency Department: Payer: Medicaid Other

## 2021-12-18 ENCOUNTER — Other Ambulatory Visit: Payer: Self-pay

## 2021-12-18 ENCOUNTER — Encounter: Payer: Self-pay | Admitting: Emergency Medicine

## 2021-12-18 DIAGNOSIS — R1084 Generalized abdominal pain: Secondary | ICD-10-CM

## 2021-12-18 DIAGNOSIS — R0981 Nasal congestion: Secondary | ICD-10-CM | POA: Insufficient documentation

## 2021-12-18 DIAGNOSIS — K769 Liver disease, unspecified: Secondary | ICD-10-CM | POA: Insufficient documentation

## 2021-12-18 DIAGNOSIS — R7989 Other specified abnormal findings of blood chemistry: Secondary | ICD-10-CM | POA: Insufficient documentation

## 2021-12-18 DIAGNOSIS — Z20822 Contact with and (suspected) exposure to covid-19: Secondary | ICD-10-CM | POA: Insufficient documentation

## 2021-12-18 LAB — COMPREHENSIVE METABOLIC PANEL
ALT: 31 U/L (ref 0–44)
AST: 63 U/L — ABNORMAL HIGH (ref 15–41)
Albumin: 4.8 g/dL (ref 3.5–5.0)
Alkaline Phosphatase: 71 U/L (ref 38–126)
Anion gap: 19 — ABNORMAL HIGH (ref 5–15)
BUN: 23 mg/dL — ABNORMAL HIGH (ref 6–20)
CO2: 16 mmol/L — ABNORMAL LOW (ref 22–32)
Calcium: 9.5 mg/dL (ref 8.9–10.3)
Chloride: 102 mmol/L (ref 98–111)
Creatinine, Ser: 1.41 mg/dL — ABNORMAL HIGH (ref 0.61–1.24)
GFR, Estimated: >60 mL/min (ref >60)
Glucose, Bld: 111 mg/dL — ABNORMAL HIGH (ref 70–99)
Potassium: 4.2 mmol/L (ref 3.5–5.1)
Sodium: 137 mmol/L (ref 135–145)
Total Bilirubin: 0.9 mg/dL (ref 0.3–1.2)
Total Protein: 7.9 g/dL (ref 6.5–8.1)

## 2021-12-18 LAB — URINALYSIS, COMPLETE (UACMP) WITH MICROSCOPIC
Bilirubin Urine: NEGATIVE
Glucose, UA: NEGATIVE mg/dL
Ketones, ur: 80 mg/dL — AB
Leukocytes,Ua: NEGATIVE
Nitrite: NEGATIVE
Protein, ur: 30 mg/dL — AB
Specific Gravity, Urine: 1.017 (ref 1.005–1.030)
pH: 5 (ref 5.0–8.0)

## 2021-12-18 LAB — CBC WITH DIFFERENTIAL/PLATELET
Abs Immature Granulocytes: 0.16 10*3/uL — ABNORMAL HIGH (ref 0.00–0.07)
Basophils Absolute: 0.1 10*3/uL (ref 0.0–0.1)
Basophils Relative: 0 %
Eosinophils Absolute: 0 10*3/uL (ref 0.0–0.5)
Eosinophils Relative: 0 %
HCT: 48.5 % (ref 39.0–52.0)
Hemoglobin: 15.8 g/dL (ref 13.0–17.0)
Immature Granulocytes: 1 %
Lymphocytes Relative: 8 %
Lymphs Abs: 1.8 10*3/uL (ref 0.7–4.0)
MCH: 27.9 pg (ref 26.0–34.0)
MCHC: 32.6 g/dL (ref 30.0–36.0)
MCV: 85.7 fL (ref 80.0–100.0)
Monocytes Absolute: 1.8 10*3/uL — ABNORMAL HIGH (ref 0.1–1.0)
Monocytes Relative: 8 %
Neutro Abs: 18 10*3/uL — ABNORMAL HIGH (ref 1.7–7.7)
Neutrophils Relative %: 83 %
Platelets: 170 10*3/uL (ref 150–400)
RBC: 5.66 MIL/uL (ref 4.22–5.81)
RDW: 13.3 % (ref 11.5–15.5)
WBC: 21.9 10*3/uL — ABNORMAL HIGH (ref 4.0–10.5)
nRBC: 0 % (ref 0.0–0.2)

## 2021-12-18 LAB — RESP PANEL BY RT-PCR (FLU A&B, COVID) ARPGX2
Influenza A by PCR: NEGATIVE
Influenza B by PCR: NEGATIVE
SARS Coronavirus 2 by RT PCR: NEGATIVE

## 2021-12-18 LAB — BASIC METABOLIC PANEL
Anion gap: 11 (ref 5–15)
BUN: 20 mg/dL (ref 6–20)
CO2: 22 mmol/L (ref 22–32)
Calcium: 8.6 mg/dL — ABNORMAL LOW (ref 8.9–10.3)
Chloride: 105 mmol/L (ref 98–111)
Creatinine, Ser: 1.12 mg/dL (ref 0.61–1.24)
GFR, Estimated: >60 mL/min (ref >60)
Glucose, Bld: 80 mg/dL (ref 70–99)
Potassium: 4.8 mmol/L (ref 3.5–5.1)
Sodium: 138 mmol/L (ref 135–145)

## 2021-12-18 LAB — LIPASE, BLOOD: Lipase: 23 U/L (ref 11–51)

## 2021-12-18 IMAGING — CT CT ABD-PELV W/ CM
2 of 4 series · 15 of 46 positions shown, 17 images · IV contrast (APPLIED)
Comparison: None.

CLINICAL DATA: Abdominal pain and nausea and vomiting

EXAM:
CT ABDOMEN AND PELVIS WITH CONTRAST
TECHNIQUE: Multidetector CT imaging of the abdomen and pelvis was performed
using the standard protocol following bolus administration of
intravenous contrast.

[Series 2: routine abd/pel with · axial · 0.72mm/px · z∈[-380,+60]mm · 12 of 96 slices shown, 14 images]
[im 4/96  soft-tissue]
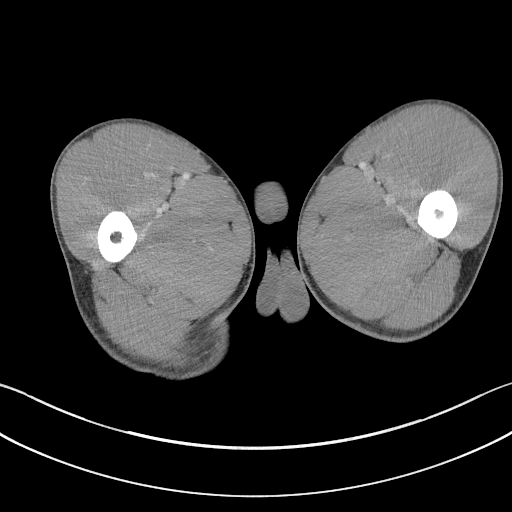
[im 4/96  bone]
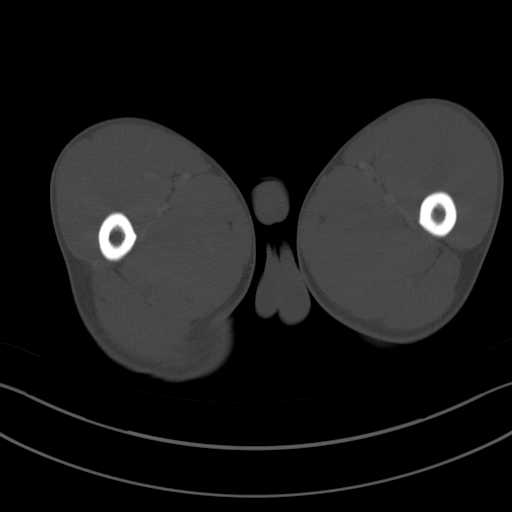
[im 12/96  soft-tissue]
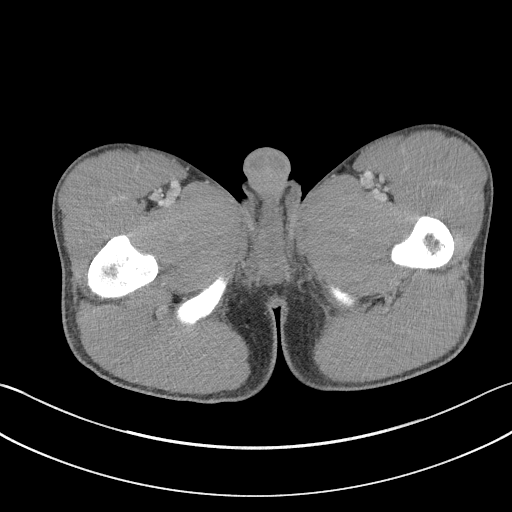
[im 20/96  soft-tissue]
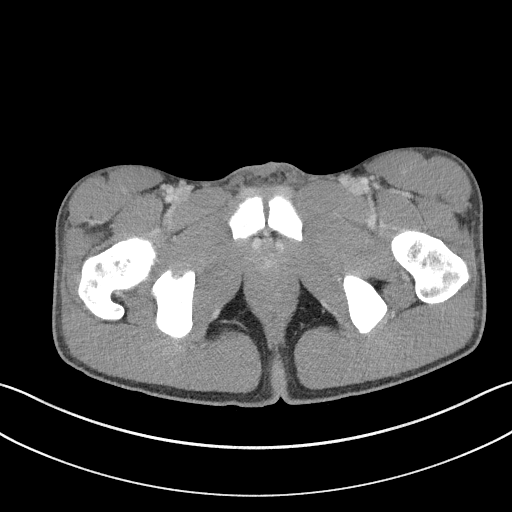
[im 28/96  soft-tissue]
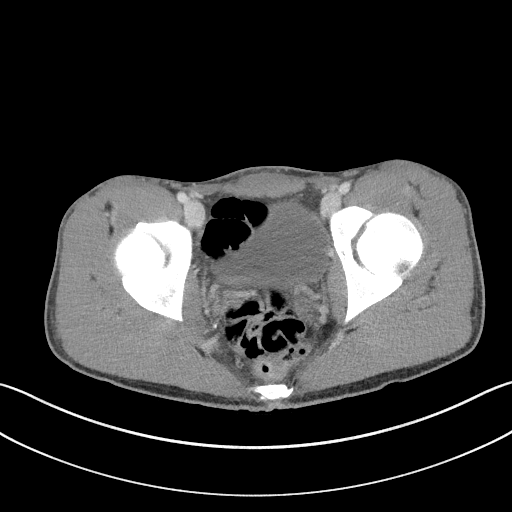
[im 36/96  soft-tissue]
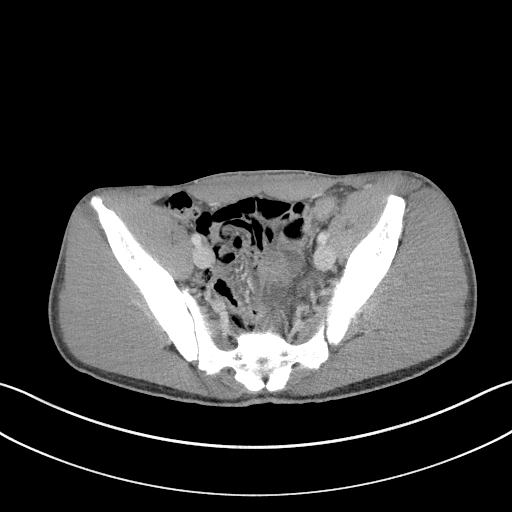
[im 44/96  soft-tissue]
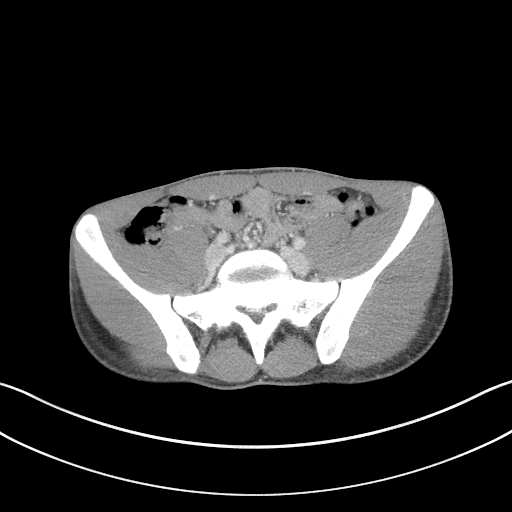
[im 52/96  soft-tissue]
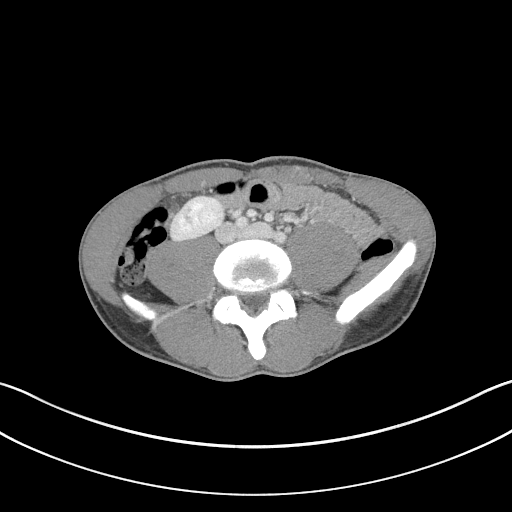
[im 60/96  soft-tissue]
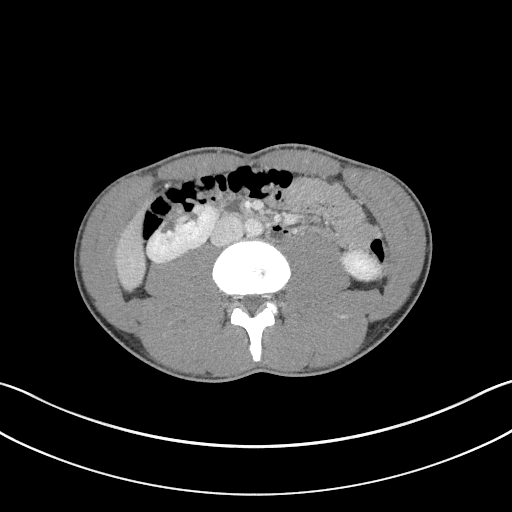
[im 68/96  soft-tissue]
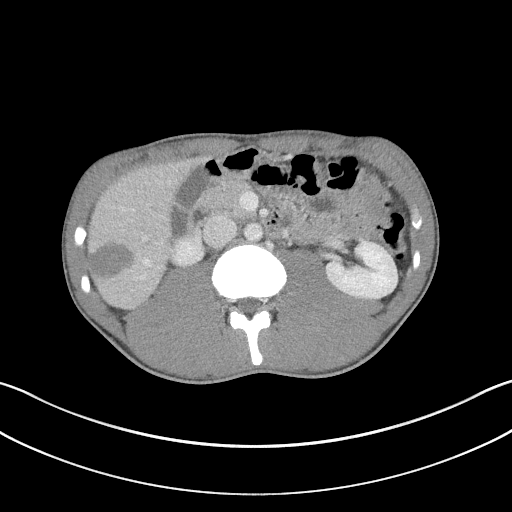
[im 68/96  bone]
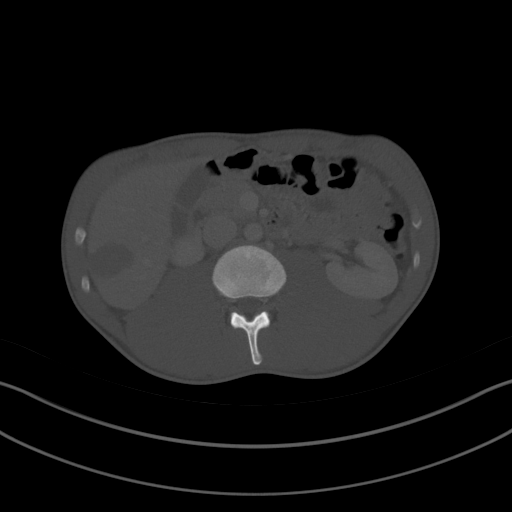
[im 76/96  soft-tissue]
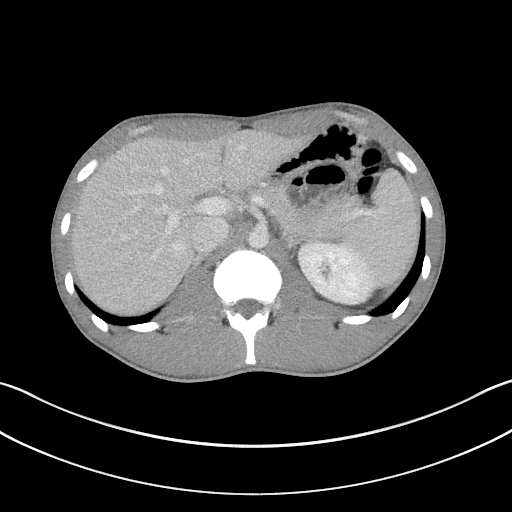
[im 84/96  soft-tissue]
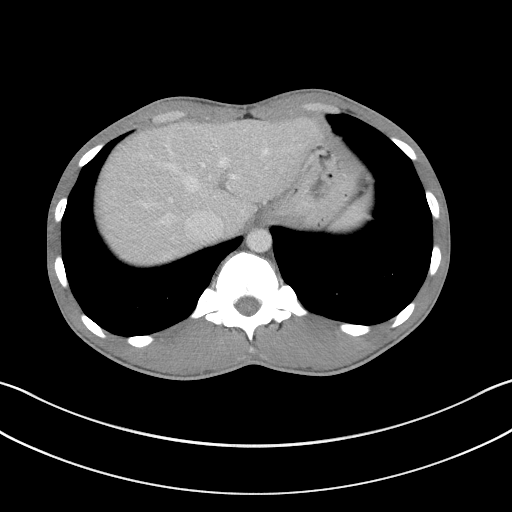
[im 92/96  soft-tissue]
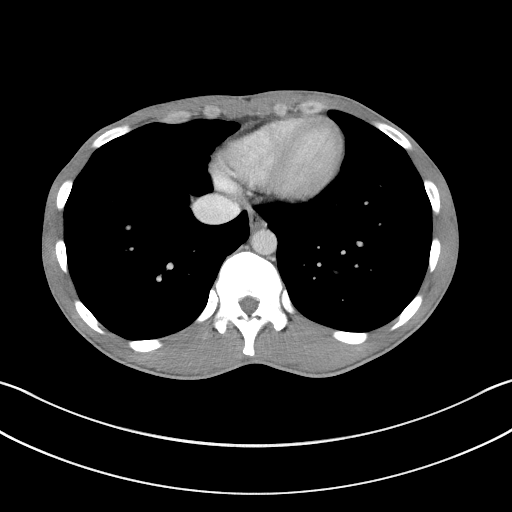

[Series 4: coronal st · coronal · 0.65mm/px · 3 of 72 slices shown]
[im 24/72  soft-tissue]
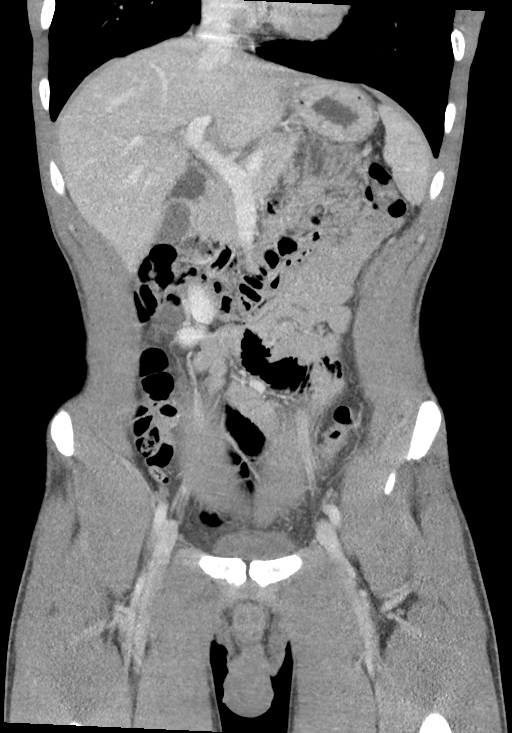
[im 32/72  soft-tissue]
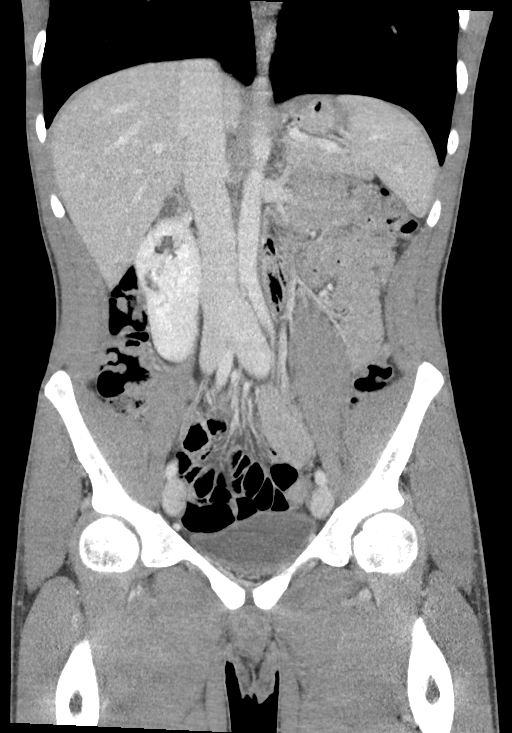
[im 40/72  soft-tissue]
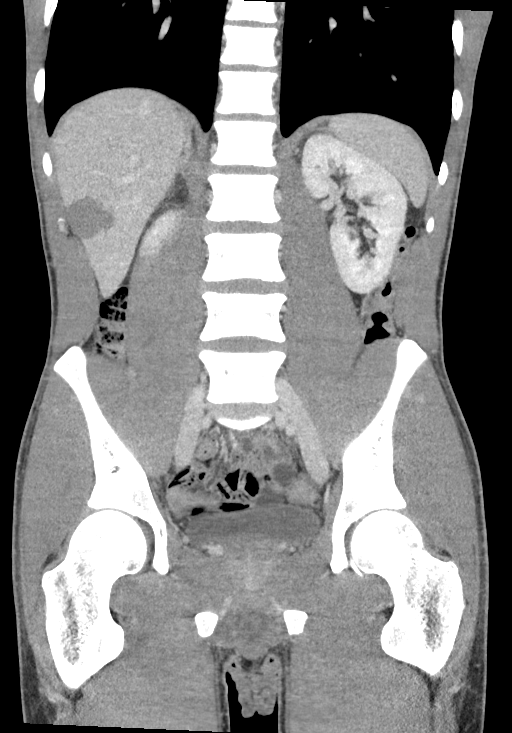

[15 of 46 positions shown; findings below may reference images not displayed]

RADIATION DOSE REDUCTION: This exam was performed according to the
departmental dose-optimization program which includes automated
exposure control, adjustment of the mA and/or kV according to
patient size and/or use of iterative reconstruction technique.

CONTRAST:  100mL OMNIPAQUE IOHEXOL 300 MG/ML  SOLN
FINDINGS: Lower chest: No acute abnormality.

Hepatobiliary: Hypodense 3.5 cm lesion in the inferior right hepatic
lobe on image [DATE] which demonstrates Hounsfield units greater than
that expected for a simple cyst, and incompletely evaluated on this
single-phase study but statistically likely reflect a benign hepatic
hemangioma. Hypodense 5 mm lesion in the right lobe of the liver on
image [DATE] is also incompletely characterized on this study but
statistically likely benign.

Pancreas: No pancreatic ductal dilation or evidence of acute
inflammation.

Spleen: No splenomegaly or focal splenic lesion.

Adrenals/Urinary Tract: Bilateral adrenal glands appear normal. No
hydronephrosis. Malrotated right kidney. No solid enhancing renal
mass. Urinary bladder is unremarkable for degree of distension.

Stomach/Bowel: No enteric contrast was administered. Stomach is
unremarkable for degree of distension. No pathologic dilation of
small or large bowel. The appendix is not confidently identified
however there is no pericecal inflammation. No evidence of acute
bowel inflammation.

Vascular/Lymphatic: Normal caliber abdominal aorta. No
pathologically enlarged abdominal or pelvic lymph nodes.

Reproductive: Prostate is unremarkable.

Other: No abdominopelvic free fluid.  No pneumoperitoneum.

Musculoskeletal: No acute or significant osseous findings.
IMPRESSION: 1. No acute abdominopelvic findings.
2. Hypodense hepatic lesions (2) measuring up to 3.5 cm with the
larger lesion demonstrating Hounsfield units greater than that
expected for a simple cyst and incompletely evaluated on this
single-phase study but statistically likely reflect a benign hepatic
hemangiomas. If more definitive characterization is clinically
indicated consider further evaluation with nonemergent hepatic
protocol MRI with and without contrast, preferably as an outpatient,
upon resolution of patient's acute problem when they are able to
follow commands including breath hold.

## 2021-12-18 MED ORDER — SODIUM CHLORIDE 0.9 % IV BOLUS
1000.0000 mL | Freq: Once | INTRAVENOUS | Status: AC
  Administered 2021-12-18: 1000 mL via INTRAVENOUS

## 2021-12-18 MED ORDER — ONDANSETRON HCL 4 MG/2ML IJ SOLN
4.0000 mg | Freq: Once | INTRAMUSCULAR | Status: AC
  Administered 2021-12-18: 4 mg via INTRAVENOUS
  Filled 2021-12-18: qty 2

## 2021-12-18 MED ORDER — IOHEXOL 300 MG/ML  SOLN
100.0000 mL | Freq: Once | INTRAMUSCULAR | Status: AC | PRN
  Administered 2021-12-18: 100 mL via INTRAVENOUS
  Filled 2021-12-18: qty 100

## 2021-12-18 NOTE — Discharge Instructions (Addendum)
-  Follow-up with your primary care provider as discussed for follow-up MRI of hepatic mass. -Return to the emergency department anytime if you begin to experience any new or worsening symptoms.

## 2021-12-18 NOTE — ED Triage Notes (Signed)
Presents with abd pain with n/v  states he went drinking last pm   woke up with vomiting and some stomach pain  states he developed a sore throat after vomiting

## 2021-12-18 NOTE — ED Provider Notes (Signed)
[facility] Provider Note    Event Date/Time   First MD Initiated Contact with Patient 12/18/21 1119     (approximate)   History   Chief Complaint Abdominal Pain (/) and Emesis   HPI Adam Guerra is a 26 y.o. male, no remarkable medical history, presents to the emergency department for evaluation of abdominal pain and vomiting.  Patient states that he was suddenly woken up last night around 4 AM with abdominal pain and vomiting.  Reports multiple episodes of vomiting.  Patient states that his abdominal pain is centralized in his abdomen, described as a sharp pain.  Additionally reports some sinus congestion.  Denies fever/chills, back pain, flank pain, headache, urinary symptoms, or headache.  History Limitations: No limitations      Physical Exam  Triage Vital Signs: ED Triage Vitals  Enc Vitals Group     BP      Pulse      Resp      Temp      Temp src      SpO2      Weight      Height      Head Circumference      Peak Flow      Pain Score      Pain Loc      Pain Edu?      Excl. in [place]?     Most recent vital signs: Vitals:   12/18/21 1522 12/18/21 1855  BP: 134/63 122/73  Pulse: 89 96  Resp: 18 16  Temp: 98.2 F (36.8 C)   SpO2: 96% 100%    General: Awake, uncomfortable.  Laying supine on bed. CV: Good peripheral perfusion.  Resp: Normal effort.  Lung sounds clear bilaterally in the apices and bases. Abd: Soft, no distention.  Mild tenderness appreciated in the epigastric umbilical region.  No guarding. Neuro: At baseline. No gross neurological deficits. Other: No CVA tenderness.  Physical Exam    ED Results / Procedures / Treatments  Labs (all labs ordered are listed, but only abnormal results are displayed) Labs Reviewed  COMPREHENSIVE METABOLIC PANEL - Abnormal; Notable for the following components:      Result Value   CO2 16 (*)    Glucose, Bld 111 (*)    BUN 23 (*)    Creatinine, Ser 1.41 (*)    AST 63 (*)     Anion gap 19 (*)    All other components within normal limits  CBC WITH DIFFERENTIAL/PLATELET - Abnormal; Notable for the following components:   WBC 21.9 (*)    Neutro Abs 18.0 (*)    Monocytes Absolute 1.8 (*)    Abs Immature Granulocytes 0.16 (*)    All other components within normal limits  URINALYSIS, COMPLETE (UACMP) WITH MICROSCOPIC - Abnormal; Notable for the following components:   Color, Urine YELLOW (*)    APPearance CLEAR (*)    Hgb urine dipstick MODERATE (*)    Ketones, ur 80 (*)    Protein, ur 30 (*)    Bacteria, UA RARE (*)    All other components within normal limits  BASIC METABOLIC PANEL - Abnormal; Notable for the following components:   Calcium 8.6 (*)    All other components within normal limits  RESP PANEL BY RT-PCR (FLU A&B, COVID) ARPGX2  LIPASE, BLOOD     EKG Not applicable.   RADIOLOGY  ED Provider Interpretation: I personally reviewed and interpreted the CT, no acute findings noted.  CT Abdomen Pelvis W Contrast  Result Date: 12/18/2021 CLINICAL DATA:  Abdominal pain and nausea and vomiting EXAM: CT ABDOMEN AND PELVIS WITH CONTRAST TECHNIQUE: Multidetector CT imaging of the abdomen and pelvis was performed using the standard protocol following bolus administration of intravenous contrast. RADIATION DOSE REDUCTION: This exam was performed according to the departmental dose-optimization program which includes automated exposure control, adjustment of the mA and/or kV according to patient size and/or use of iterative reconstruction technique. CONTRAST:  168mL OMNIPAQUE IOHEXOL 300 MG/ML  SOLN COMPARISON:  None. FINDINGS: Lower chest: No acute abnormality. 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 Hypodense 5 mm  lesion in the right lobe of the liver on image 16/2 is also incompletely characterized on this study but statistically likely benign. Pancreas: No pancreatic ductal dilation or evidence of acute inflammation. Spleen: No splenomegaly or focal splenic lesion. Adrenals/Urinary Tract: Bilateral adrenal glands appear normal. No hydronephrosis. Malrotated right kidney. No solid enhancing renal mass. Urinary bladder is unremarkable for degree of distension. Stomach/Bowel: No enteric contrast was administered. Stomach is unremarkable for degree of distension. No pathologic dilation of small or large bowel. The appendix is not confidently identified however there is no pericecal inflammation. No evidence of acute bowel inflammation. Vascular/Lymphatic: Normal caliber abdominal aorta. No pathologically enlarged abdominal or pelvic lymph nodes. Reproductive: Prostate is unremarkable. Other: No abdominopelvic free fluid.  No pneumoperitoneum. Musculoskeletal: No acute or significant osseous findings. IMPRESSION: 1. No acute abdominopelvic findings. 2. 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 If more definitive characterization is clinically indicated consider further evaluation with nonemergent hepatic protocol MRI with and without contrast, preferably as an outpatient, upon resolution of patient's acute problem when they are able to follow commands including breath hold. Electronically Signed   By: Dahlia Bailiff M.D.   On: 12/18/2021 14:33    PROCEDURES:  Critical Care performed: None.  Procedures    MEDICATIONS ORDERED IN ED: Medications  sodium chloride 0.9 % bolus 1,000 mL (0 mLs Intravenous Stopped 12/18/21 1522)  ondansetron (ZOFRAN) injection 4 mg (4 mg Intravenous Given 12/18/21 1522)   iohexol (OMNIPAQUE) 300 MG/ML solution 100 mL (100 mLs Intravenous Contrast Given 12/18/21 1411)  sodium chloride 0.9 % bolus 1,000 mL (0 mLs Intravenous Stopped 12/18/21 1735)     IMPRESSION / MDM / ASSESSMENT AND PLAN / ED COURSE  I reviewed the triage vital signs and the nursing notes.                              Adam Guerra is a 26 y.o. male, no remarkable medical history, presents to the emergency department for evaluation of abdominal pain and vomiting.  Patient states that he was suddenly woken up last night around 4 AM with abdominal pain and vomiting.  Reports multiple episodes of vomiting.  Patient states that his abdominal pain is centralized in his abdomen, described as a sharp pain.  Differential diagnosis includes, but is not limited to, pancreatitis, appendicitis, cholecystitis, cholangitis, gastroenteritis, diverticulitis, influenza/COVID-19  ED Course Patient appears uncomfortable, but stable, vitals within normal limits.  We will go ahead and treat with IV fluids given patient's endorsement of several episodes of vomiting.  CBC notable for significant leukocytosis at 21.9.  No anemia.  CMP shows elevated creatinine at 1.4 with anion gap of 19  Lipase unremarkable at 23.  Unlikely pancreatitis.  Respiratory panel negative for COVID-19 or influenza.  Urinalysis notable for ketonuria, proteinuria, moderate hemoglobin.  Likely due to dehydration.  Rare bacteria present, however given lack of leukocytes and lack of endorsement of urinary symptoms, unlikely UTI.  CT abdomen negative for acute findings.  Incidental finding on liver, see above for details.  After the second 1000 mL normal saline bolus, patient was reevaluated and had BMP rechecked.  Patient states that he feels much better.  Creatinine decreased down to 1.12.  No anion gap present.   Assessment/Plan Given the patient's history, physical exam, and work-up, I do not suspect any serious or  life-threatening pathology.  Reassured by CT imaging and improvement in symptoms.  Incidentally found hepatic lesion on CT scan.  Notified the patient of this lesion and advised him to follow-up with his primary care provider to schedule outpatient MRI for further evaluation.  No further work-up or treatment indicated in the emergency department at this time.  We will discharge this patient.  Patient was provided with anticipatory guidance, return precautions, and educational material. Encouraged the patient to return to the emergency department at any time if they begin to experience any new or worsening symptoms.       FINAL CLINICAL IMPRESSION(S) / ED DIAGNOSES   Final diagnoses:  Liver lesion  Generalized abdominal pain     Rx / DC Orders   ED Discharge Orders     None        Note:  This document was prepared using Dragon voice recognition software and may include unintentional dictation errors.   Teodoro Spray, [place] 12/18/21 1935    Lucrezia Starch, MD 12/18/21 2137

## 2022-05-25 ENCOUNTER — Ambulatory Visit: Payer: Self-pay | Admitting: Family Medicine

## 2022-05-25 ENCOUNTER — Encounter: Payer: Self-pay | Admitting: Family Medicine

## 2022-05-25 DIAGNOSIS — Z113 Encounter for screening for infections with a predominantly sexual mode of transmission: Secondary | ICD-10-CM

## 2022-05-25 DIAGNOSIS — Z202 Contact with and (suspected) exposure to infections with a predominantly sexual mode of transmission: Secondary | ICD-10-CM

## 2022-05-25 LAB — HEPATITIS B SURFACE ANTIGEN: Hepatitis B Surface Ag: NONREACTIVE

## 2022-05-25 LAB — GRAM STAIN

## 2022-05-25 LAB — HM HEPATITIS C SCREENING LAB: HM Hepatitis Screen: NEGATIVE

## 2022-05-25 LAB — HM HIV SCREENING LAB: HM HIV Screening: NEGATIVE

## 2022-05-25 MED ORDER — METRONIDAZOLE 500 MG PO TABS: 500.0000 mg | ORAL_TABLET | Freq: Two times a day (BID) | ORAL | 0 refills | Status: AC

## 2022-05-25 NOTE — Progress Notes (Signed)
Pt here for STD screening.  Gram stain results reviewed.  The patient was dispensed Metronidazole 500 mg #14 today. I provided counseling today regarding the medication. We discussed the medication, the side effects and when to call clinic. Patient given the opportunity to ask questions. Questions answered.  Berdie Ogren, RN

## 2022-05-25 NOTE — Progress Notes (Signed)
[facility] Department STI clinic/screening visit  Subjective:  KYUSS HALE is a 26 y.o. male being seen today for an STI screening visit. The patient reports they do have symptoms.    Patient has the following medical conditions:  There are no problems to display for this patient.    Chief Complaint  Patient presents with   SEXUALLY TRANSMITTED DISEASE    Screening- patient stated he is burning when urinating     HPI  Patient reports he is here for a STD screening.  States that his partner was treated for a UTI- Client called client while being interviewed about her meds she took and her labs results.  Client was given Flagyl and Cipro.  Client states that he was treated for contact to trich 2 years ago.  States he and partner have been together 8 years- no other partners  Does the patient or their partner desires a pregnancy in the next year? No  Screening for MPX risk: Does the patient have an unexplained rash? No Is the patient MSM? No Does the patient endorse multiple sex partners or anonymous sex partners? No Did the patient have close or sexual contact with a person diagnosed with MPX? No Has the patient traveled outside the [place] where MPX is endemic? No Is there a high clinical suspicion for MPX-- evidenced by one of the following No  -Unlikely to be chickenpox  -Lymphadenopathy  -Rash that present in same phase of evolution on any given body part   See flowsheet for further details and programmatic requirements.   Immunization History  Administered Date(s) Administered   HPV Quadrivalent 05/11/2012   Hepatitis B [birth year]-25[birth year]96, 06/12/1996, 01/31/1997   MMR 04/03/1997, 08/16/2000   Meningococcal Conjugate 11/15/2007   Tdap 12/08/2017   Varicella 04/03/1997, 11/15/2007     The following portions of the patient's history were reviewed and updated as appropriate: allergies, current medications, past medical history, past social history, past surgical  history and problem list.  Objective:  There were no vitals filed for this visit.  Physical Exam Constitutional:      Appearance: Normal appearance.  HENT:     Head: Normocephalic and atraumatic.     Comments: No nits or hair loss    Mouth/Throat:     Mouth: Mucous membranes are moist.     Pharynx: Oropharynx is clear. No oropharyngeal exudate or posterior oropharyngeal erythema.  Pulmonary:     Effort: Pulmonary effort is normal.  Abdominal:     General: Abdomen is flat.     Palpations: Abdomen is soft. There is no hepatomegaly or mass.     Tenderness: There is no abdominal tenderness.  Genitourinary:    Pubic Area: No rash or pubic lice (no nits).      Penis: Normal. No tenderness, discharge, swelling or lesions.      Testes: Normal.     Epididymis:     Right: Normal.     Left: Normal.  Lymphadenopathy:     Head:     Right side of head: No preauricular or posterior auricular adenopathy.     Left side of head: No preauricular or posterior auricular adenopathy.     Cervical: No cervical adenopathy.     Upper Body:     Right upper body: No supraclavicular, axillary or epitrochlear adenopathy.     Left upper body: No supraclavicular, axillary or epitrochlear adenopathy.     Lower Body: No right inguinal adenopathy. No left inguinal adenopathy.  Skin:  General: Skin is warm and dry.     Findings: No rash.  Neurological:     Mental Status: He is alert and oriented to person, place, and time.    Assessment and Plan:  MELVILLE ENGEN is a 26 y.o. male presenting to the [facility] Department for STI screening  1. Screening examination for venereal disease Treat gram stain as per SO. - Gram stain - Hepatitis Serology, [facility] - HIV/HCV [facility] - Syphilis Serology, [facility] - Chlamydia/GC NAA, Confirmation  2. Trichomonas contact  - metroNIDAZOLE (FLAGYL) 500 MG tablet; Take 1 tablet (500 mg total) by mouth 2 (two) times daily for 7 days.   Dispense: 14 tablet; Refill: 0  Co to abstain from sexual activity x 7 days and to avoid ETOH while on medication.  Return if symptoms worsen or fail to improve.  No future appointments.  Hassell Done, FNP

## 2022-05-28 LAB — CHLAMYDIA/GC NAA, CONFIRMATION
Chlamydia trachomatis, NAA: NEGATIVE
Neisseria gonorrhoeae, NAA: NEGATIVE

## 2022-09-24 DIAGNOSIS — Z419 Encounter for procedure for purposes other than remedying health state, unspecified: Secondary | ICD-10-CM | POA: Diagnosis not present

## 2022-10-25 DIAGNOSIS — Z419 Encounter for procedure for purposes other than remedying health state, unspecified: Secondary | ICD-10-CM | POA: Diagnosis not present

## 2022-11-25 DIAGNOSIS — Z419 Encounter for procedure for purposes other than remedying health state, unspecified: Secondary | ICD-10-CM | POA: Diagnosis not present

## 2022-11-30 ENCOUNTER — Telehealth: Payer: Self-pay

## 2022-11-30 NOTE — Telephone Encounter (Signed)
Mychart msg sent. AS, CMA 

## 2023-08-28 ENCOUNTER — Emergency Department
Admission: EM | Admit: 2023-08-28 | Discharge: 2023-08-28 | Disposition: A | Discharge status: Home / Self Care | Payer: Medicaid Other | Attending: Emergency Medicine | Admitting: Emergency Medicine

## 2023-08-28 ENCOUNTER — Other Ambulatory Visit: Payer: Self-pay

## 2023-08-28 ENCOUNTER — Emergency Department: Payer: Medicaid Other

## 2023-08-28 DIAGNOSIS — D72829 Elevated white blood cell count, unspecified: Secondary | ICD-10-CM | POA: Insufficient documentation

## 2023-08-28 DIAGNOSIS — E8729 Other acidosis: Secondary | ICD-10-CM

## 2023-08-28 DIAGNOSIS — R1110 Vomiting, unspecified: Secondary | ICD-10-CM | POA: Diagnosis present

## 2023-08-28 DIAGNOSIS — E1310 Other specified diabetes mellitus with ketoacidosis without coma: Secondary | ICD-10-CM | POA: Insufficient documentation

## 2023-08-28 LAB — CBC
HCT: 48.2 % (ref 39.0–52.0)
Hemoglobin: 15.7 g/dL (ref 13.0–17.0)
MCH: 28.9 pg (ref 26.0–34.0)
MCHC: 32.6 g/dL (ref 30.0–36.0)
MCV: 88.6 fL (ref 80.0–100.0)
Platelets: 226 10*3/uL (ref 150–400)
RBC: 5.44 MIL/uL (ref 4.22–5.81)
RDW: 13.6 % (ref 11.5–15.5)
WBC: 27.9 10*3/uL — ABNORMAL HIGH (ref 4.0–10.5)
nRBC: 0 % (ref 0.0–0.2)

## 2023-08-28 LAB — COMPREHENSIVE METABOLIC PANEL
ALT: 108 U/L — ABNORMAL HIGH (ref 0–44)
AST: 208 U/L — ABNORMAL HIGH (ref 15–41)
Albumin: 5.2 g/dL — ABNORMAL HIGH (ref 3.5–5.0)
Alkaline Phosphatase: 68 U/L (ref 38–126)
Anion gap: 24 — ABNORMAL HIGH (ref 5–15)
BUN: 21 mg/dL — ABNORMAL HIGH (ref 6–20)
CO2: 15 mmol/L — ABNORMAL LOW (ref 22–32)
Calcium: 9.3 mg/dL (ref 8.9–10.3)
Chloride: 104 mmol/L (ref 98–111)
Creatinine, Ser: 1.31 mg/dL — ABNORMAL HIGH (ref 0.61–1.24)
GFR, Estimated: >60 mL/min (ref >60)
Glucose, Bld: 92 mg/dL (ref 70–99)
Potassium: 3.4 mmol/L — ABNORMAL LOW (ref 3.5–5.1)
Sodium: 143 mmol/L (ref 135–145)
Total Bilirubin: 0.6 mg/dL (ref 0.3–1.2)
Total Protein: 8.5 g/dL — ABNORMAL HIGH (ref 6.5–8.1)

## 2023-08-28 LAB — URINALYSIS, ROUTINE W REFLEX MICROSCOPIC
Bacteria, UA: NONE SEEN
Bilirubin Urine: NEGATIVE
Glucose, UA: NEGATIVE mg/dL
Ketones, ur: 20 mg/dL — AB
Leukocytes,Ua: NEGATIVE
Nitrite: NEGATIVE
Protein, ur: NEGATIVE mg/dL
Specific Gravity, Urine: 1.014 (ref 1.005–1.030)
Squamous Epithelial / HPF: 0 /[HPF] (ref 0–5)
pH: 5 (ref 5.0–8.0)

## 2023-08-28 LAB — LIPASE, BLOOD: Lipase: 24 U/L (ref 11–51)

## 2023-08-28 MED ORDER — KETOROLAC TROMETHAMINE 30 MG/ML IJ SOLN
30.0000 mg | Freq: Once | INTRAMUSCULAR | Status: AC
  Administered 2023-08-28: 30 mg via INTRAVENOUS
  Filled 2023-08-28: qty 1

## 2023-08-28 MED ORDER — ONDANSETRON HCL 4 MG/2ML IJ SOLN
4.0000 mg | Freq: Once | INTRAMUSCULAR | Status: AC
  Administered 2023-08-28: 4 mg via INTRAVENOUS
  Filled 2023-08-28: qty 2

## 2023-08-28 MED ORDER — SODIUM CHLORIDE 0.9 % IV BOLUS
500.0000 mL | Freq: Once | INTRAVENOUS | Status: AC
  Administered 2023-08-28: 500 mL via INTRAVENOUS

## 2023-08-28 NOTE — ED Provider Notes (Signed)
[facility] Provider Note    Event Date/Time   First MD Initiated Contact with Patient 08/28/23 1332     (approximate)   History   Emesis   HPI  Adam Guerra is a 27 y.o. male who presents with complaints of nausea vomiting after excessive alcohol use.  Patient reports he drank a lot of alcohol last night, also had marijuana.  Started vomiting last night,, now is having dry heaves only.  No significant abdominal pain, no chest pain, some discomfort in his back which he thinks is from vomiting.     Physical Exam   Triage Vital Signs: ED Triage Vitals  Encounter Vitals Group     BP 08/28/23 1328 112/66     Systolic BP Percentile --      Diastolic BP Percentile --      Pulse Rate 08/28/23 1328 (!) 102     Resp 08/28/23 1328 18     Temp 08/28/23 1328 97.6 F (36.4 C)     Temp Source 08/28/23 1328 Oral     SpO2 08/28/23 1328 99 %     Weight 08/28/23 1327 57.6 kg (127 lb)     Height 08/28/23 1327 1.778 m (5\' 10" )     Head Circumference --      Peak Flow --      Pain Score 08/28/23 1327 5     Pain Loc --      Pain Education --      Exclude from Growth Chart --     Most recent vital signs: Vitals:   08/28/23 1328  BP: 112/66  Pulse: (!) 102  Resp: 18  Temp: 97.6 F (36.4 C)  SpO2: 99%     General: Awake, no distress.  CV:  Good peripheral perfusion.  Resp:  Normal effort.  Abd:  No distention.  Soft, nontender Other:  Pharynx: Erythematous   ED Results / Procedures / Treatments   Labs (all labs ordered are listed, but only abnormal results are displayed) Labs Reviewed  COMPREHENSIVE METABOLIC PANEL - Abnormal; Notable for the following components:      Result Value   Potassium 3.4 (*)    CO2 15 (*)    BUN 21 (*)    Creatinine, Ser 1.31 (*)    Total Protein 8.5 (*)    Albumin 5.2 (*)    AST 208 (*)    ALT 108 (*)    Anion gap 24 (*)    All other components within normal limits  CBC - Abnormal; Notable for the  following components:   WBC 27.9 (*)    All other components within normal limits  URINALYSIS, ROUTINE W REFLEX MICROSCOPIC - Abnormal; Notable for the following components:   Color, Urine STRAW (*)    APPearance CLEAR (*)    Hgb urine dipstick SMALL (*)    Ketones, ur 20 (*)    All other components within normal limits  LIPASE, BLOOD     EKG     RADIOLOGY Chest x-ray viewed interpret by me, no acute abnormality    PROCEDURES:  Critical Care performed:   Procedures   MEDICATIONS ORDERED IN ED: Medications  ondansetron (ZOFRAN) injection 4 mg (4 mg Intravenous Given 08/28/23 1414)  ketorolac (TORADOL) 30 MG/ML injection 30 mg (30 mg Intravenous Given 08/28/23 1415)  sodium chloride 0.9 % bolus 500 mL (0 mLs Intravenous Stopped 08/28/23 1433)     IMPRESSION / MDM / ASSESSMENT AND PLAN / ED COURSE  I reviewed the triage vital signs and the nursing notes. Patient's presentation is most consistent with acute illness / injury with system symptoms.  Patient presents with nausea vomiting in the setting of alcohol use, consistent with alcohol intoxication/withdrawal.  No chest pain, no abdominal pain.  Overall well-appearing and in no acute distress, mild tachycardia but otherwise reassuring vitals.  Will treat with IV fluids, IV Toradol, IV Zofran and check labs and reevaluate  Lab work notable for elevated white blood cell count  Patient also with evidence of elevated anion gap despite normal glucose, most consistent with alcoholic ketoacidosis  ----------------------------------------- 3:57 PM on 08/28/2023 ----------------------------------------- After treatment, patient feeling much better, no further vomiting, he states he is ready for discharge.  On reeval, no abdominal discomfort, no back discomfort.  Well-appearing, normal heart rate  Discussed elevated LFTs and need for close follow-up for this with PCP        FINAL CLINICAL IMPRESSION(S) / ED DIAGNOSES    Final diagnoses:  Alcoholic ketoacidosis     Rx / DC Orders   ED Discharge Orders     None        Note:  This document was prepared using Dragon voice recognition software and may include unintentional dictation errors.   Jene Every, MD 08/28/23 [phone]

## 2023-08-28 NOTE — ED Triage Notes (Signed)
Pt states coming in with nausea smelling of marijuana and reports "I think I have alcohol poisoning", but cannot recall what he has drank in the last 24 hours. Pt states generalized weakness, sore throat and lower abdominal pain as well.
